# Patient Record
Sex: Male | Born: 1980 | Race: White | Hispanic: No | Marital: Married | Smoking: Never smoker
Health system: Southern US, Community
[De-identification: ages and names within clinical notes are randomized; demographics above are authoritative.]

## PROBLEM LIST (undated history)

## (undated) DIAGNOSIS — K8020 Calculus of gallbladder without cholecystitis without obstruction: Secondary | ICD-10-CM

## (undated) DIAGNOSIS — K219 Gastro-esophageal reflux disease without esophagitis: Secondary | ICD-10-CM

## (undated) DIAGNOSIS — K2270 Barrett's esophagus without dysplasia: Secondary | ICD-10-CM

## (undated) HISTORY — PX: WRIST SURGERY: SHX841

## (undated) SURGERY — EGD (ESOPHAGOGASTRODUODENOSCOPY)
Anesthesia: Moderate Sedation

---

## 2000-08-21 ENCOUNTER — Emergency Department (HOSPITAL_COMMUNITY): Admission: EM | Admit: 2000-08-21 | Discharge: 2000-08-21 | Payer: Self-pay | Admitting: Emergency Medicine

## 2010-09-15 ENCOUNTER — Ambulatory Visit (INDEPENDENT_AMBULATORY_CARE_PROVIDER_SITE_OTHER): Payer: Self-pay | Admitting: Surgery

## 2010-09-21 ENCOUNTER — Encounter (INDEPENDENT_AMBULATORY_CARE_PROVIDER_SITE_OTHER): Payer: Self-pay | Admitting: Surgery

## 2010-09-21 ENCOUNTER — Ambulatory Visit (INDEPENDENT_AMBULATORY_CARE_PROVIDER_SITE_OTHER): Payer: BC Managed Care – PPO | Admitting: Surgery

## 2010-09-21 VITALS — BP 126/80 | HR 65 | Temp 97.6°F | Ht 70.0 in | Wt 213.4 lb

## 2010-09-21 DIAGNOSIS — K219 Gastro-esophageal reflux disease without esophagitis: Secondary | ICD-10-CM

## 2010-09-21 DIAGNOSIS — K8010 Calculus of gallbladder with chronic cholecystitis without obstruction: Secondary | ICD-10-CM | POA: Insufficient documentation

## 2010-09-21 MED ORDER — OMEPRAZOLE-SODIUM BICARBONATE 40-1100 MG PO CAPS: 1.0000 | ORAL_CAPSULE | Freq: Every day | ORAL | Status: AC

## 2010-09-21 NOTE — Progress Notes (Signed)
Barry Robertson is a 30 y.o. male.    Chief Complaint  Patient presents with  . Abdominal Pain    HPI HPI This is a healthy 30 year old male referred by Dr. Lacie Scotts at [facility] for evaluation of gallstones. The patient has had a history of severe heartburn lasting about 8 years. He has been treating with only over-the-counter urgent generic omeprazole with minimal relief. He sleeps on several pillows. Frequently wakes up with some regurgitation and burning in his throat. He denies any abdominal pain or abdominal bloating. No change in his bowel habits. He eats fairly healthy and avoids any fried foods. He recently underwent a routine physical and was sent for an ultrasound. The ultrasound performed 08/31/10 showed cholelithiasis but no sign of cholecystitis or common bile duct obstruction. Apparently, he did have some blood work performed but we do not have that available today. He is referred for discussion of laparoscopic cholecystectomy.  Past Medical History  Diagnosis Date  . Gout     Past Surgical History  Procedure Date  . Wrist surgery     History reviewed. No pertinent family history.  Social History History  Substance Use Topics  . Smoking status: Never Smoker   . Smokeless tobacco: Never Used  . Alcohol Use: Yes    No Known Allergies  Current Outpatient Prescriptions  Medication Sig Dispense Refill  . omeprazole-sodium bicarbonate (ZEGERID) 40-1100 MG per capsule Take 1 capsule by mouth daily before breakfast.  30 capsule  3    Review of Systems ROS Review of systems reviewed with the patient and is otherwise negative except for the severe reflux Physical Exam Physical Exam   Blood pressure 126/80, pulse 65, temperature 97.6 F (36.4 C), temperature source Temporal, height 5\' 10"  (1.778 m), weight 213 lb 6.4 oz (96.798 kg). WDWN in NAD HEENT:  EOMI, sclera anicteric Neck:  No masses, no thyromegaly Lungs:  CTA bilaterally; normal  respiratory effort CV:  Regular rate and rhythm; no murmurs Abd:  +bowel sounds, soft, non-tender, no masses Ext:  Well-perfused; no edema Skin:  Warm, dry; no sign of jaundice  Assessment/Plan Cholelithiasis-this appears to be minimally symptomatic at this time and does not seem to be in urgent problem Severe GERD-this appears to be his main complaint and has been long-standing with minimal treatment. I would be concerned for possible damage to his esophagus with prolonged GERD. We will start him on prescription strength Zegerid today and we'll refer him to [facility] for evaluation and probable EGD to evaluate for esophagitis, Barrett's esophagus, etc. We will see him back after his GI evaluation is complete  Emmarie Sannes K. 09/21/2010, 9:49 AM

## 2010-10-05 ENCOUNTER — Encounter (INDEPENDENT_AMBULATORY_CARE_PROVIDER_SITE_OTHER): Payer: Self-pay | Admitting: Surgery

## 2010-10-09 ENCOUNTER — Encounter (INDEPENDENT_AMBULATORY_CARE_PROVIDER_SITE_OTHER): Payer: BC Managed Care – PPO | Admitting: Surgery

## 2010-10-23 ENCOUNTER — Encounter (INDEPENDENT_AMBULATORY_CARE_PROVIDER_SITE_OTHER): Payer: Self-pay | Admitting: Surgery

## 2016-09-09 ENCOUNTER — Encounter (INDEPENDENT_AMBULATORY_CARE_PROVIDER_SITE_OTHER): Payer: Self-pay | Admitting: Internal Medicine

## 2016-09-16 ENCOUNTER — Encounter (INDEPENDENT_AMBULATORY_CARE_PROVIDER_SITE_OTHER): Payer: Self-pay

## 2016-09-16 ENCOUNTER — Encounter (INDEPENDENT_AMBULATORY_CARE_PROVIDER_SITE_OTHER): Payer: Self-pay | Admitting: *Deleted

## 2016-09-16 ENCOUNTER — Ambulatory Visit (INDEPENDENT_AMBULATORY_CARE_PROVIDER_SITE_OTHER): Payer: Managed Care, Other (non HMO) | Admitting: Internal Medicine

## 2016-09-16 ENCOUNTER — Other Ambulatory Visit (INDEPENDENT_AMBULATORY_CARE_PROVIDER_SITE_OTHER): Payer: Self-pay | Admitting: Internal Medicine

## 2016-09-16 ENCOUNTER — Encounter (INDEPENDENT_AMBULATORY_CARE_PROVIDER_SITE_OTHER): Payer: Self-pay | Admitting: Internal Medicine

## 2016-09-16 VITALS — BP 110/80 | HR 72 | Temp 97.5°F | Ht 70.0 in | Wt 245.1 lb

## 2016-09-16 DIAGNOSIS — K219 Gastro-esophageal reflux disease without esophagitis: Secondary | ICD-10-CM

## 2016-09-16 MED ORDER — PANTOPRAZOLE SODIUM 40 MG PO TBEC: 40.0000 mg | DELAYED_RELEASE_TABLET | Freq: Two times a day (BID) | ORAL | 3 refills | Status: DC

## 2016-09-16 NOTE — Progress Notes (Signed)
   Subjective:    Patient ID: Barry Robertson, male    DOB: [birth year], 36 y.o.   MRN: [id number]  HPI Referred by Dr. Karilyn CotaGosrani for GERD. Has had GERD for 16-17 yrs. Has been taking Prilosec for at least 16 yrs. He has to take multiple Prilosec's a day. On a normal day he takes 4-8 Prilosec, Tums daily.  He says at night he might vomit, and chokes at night. He has burning in his esophagus.  [facility] and had an endo 4 yrs and was told he had a hiatal hernia. He says he has gallstones.       Review of Systems Past Medical History:  Diagnosis Date  . Gout     Past Surgical History:  Procedure Laterality Date  . WRIST SURGERY      No Known Allergies  No current outpatient prescriptions on file prior to visit.   No current facility-administered medications on file prior to visit.    Current Outpatient Prescriptions  Medication Sig Dispense Refill  . calcium carbonate (TUMS - DOSED IN MG ELEMENTAL CALCIUM) 500 MG chewable tablet Chew 1 tablet by mouth as needed for indigestion or heartburn.    Marland Kitchen. omeprazole (PRILOSEC) 20 MG capsule Take 20 mg by mouth as needed.     No current facility-administered medications for this visit.         Objective:   Physical Exam  Blood pressure 110/80, pulse 72, temperature (!) 97.5 F (36.4 C), height 5\' 10"  (1.778 m), weight 245 lb 1.6 oz (111.2 kg).  Alert and oriented. Skin warm and dry. Oral mucosa is moist.   . Sclera anicteric, conjunctivae is pink. Thyroid not enlarged. No cervical lymphadenopathy. Lungs clear. Heart regular rate and rhythm.  Abdomen is soft. Bowel sounds are positive. No hepatomegaly. No abdominal masses felt. No tenderness.  No edema to lower extremities.          Assessment & Plan:  Uncontrolled GERD. EGD. GERD Diet given to patient. Rx for Protonix sent to her pharmacy.  The risks of bleeding, perforation and infection were reviewed with patient.

## 2016-09-16 NOTE — Patient Instructions (Addendum)
EGD. The risks of bleeding, perforation and infection were reviewed with patient. GERD diet given to patient.

## 2016-11-05 ENCOUNTER — Encounter (HOSPITAL_COMMUNITY)
Admission: RE | Disposition: A | Discharge status: Home / Self Care | Payer: Self-pay | Source: Ambulatory Visit | Attending: Internal Medicine

## 2016-11-05 ENCOUNTER — Ambulatory Visit (HOSPITAL_COMMUNITY)
Admission: RE | Admit: 2016-11-05 | Discharge: 2016-11-05 | Disposition: A | Discharge status: Home / Self Care | Payer: Managed Care, Other (non HMO) | Source: Ambulatory Visit | Attending: Internal Medicine | Admitting: Internal Medicine

## 2016-11-05 ENCOUNTER — Encounter (HOSPITAL_COMMUNITY): Payer: Self-pay | Admitting: *Deleted

## 2016-11-05 DIAGNOSIS — K317 Polyp of stomach and duodenum: Secondary | ICD-10-CM | POA: Insufficient documentation

## 2016-11-05 DIAGNOSIS — K3189 Other diseases of stomach and duodenum: Secondary | ICD-10-CM

## 2016-11-05 DIAGNOSIS — Z79899 Other long term (current) drug therapy: Secondary | ICD-10-CM | POA: Diagnosis not present

## 2016-11-05 DIAGNOSIS — K449 Diaphragmatic hernia without obstruction or gangrene: Secondary | ICD-10-CM | POA: Diagnosis not present

## 2016-11-05 DIAGNOSIS — K219 Gastro-esophageal reflux disease without esophagitis: Secondary | ICD-10-CM | POA: Diagnosis not present

## 2016-11-05 DIAGNOSIS — K210 Gastro-esophageal reflux disease with esophagitis: Secondary | ICD-10-CM | POA: Diagnosis not present

## 2016-11-05 DIAGNOSIS — K228 Other specified diseases of esophagus: Secondary | ICD-10-CM | POA: Diagnosis not present

## 2016-11-05 HISTORY — PX: ESOPHAGOGASTRODUODENOSCOPY: SHX5428

## 2016-11-05 HISTORY — PX: BIOPSY: SHX5522

## 2016-11-05 SURGERY — EGD (ESOPHAGOGASTRODUODENOSCOPY)
Anesthesia: Moderate Sedation

## 2016-11-05 MED ORDER — FAMOTIDINE 20 MG PO TABS: 20.0000 mg | ORAL_TABLET | Freq: Every day | ORAL | Status: DC

## 2016-11-05 MED ORDER — MEPERIDINE HCL 50 MG/ML IJ SOLN
INTRAMUSCULAR | Status: DC | PRN
  Administered 2016-11-05 (×4): 25 mg via INTRAVENOUS

## 2016-11-05 MED ORDER — LIDOCAINE VISCOUS 2 % MT SOLN
OROMUCOSAL | Status: DC | PRN
  Administered 2016-11-05: 4 mL via OROMUCOSAL

## 2016-11-05 MED ORDER — LIDOCAINE VISCOUS 2 % MT SOLN
OROMUCOSAL | Status: AC
  Filled 2016-11-05: qty 15

## 2016-11-05 MED ORDER — MEPERIDINE HCL 50 MG/ML IJ SOLN
INTRAMUSCULAR | Status: AC
  Filled 2016-11-05: qty 1

## 2016-11-05 MED ORDER — STERILE WATER FOR IRRIGATION IR SOLN
Status: DC | PRN
  Administered 2016-11-05: 11:00:00

## 2016-11-05 MED ORDER — SODIUM CHLORIDE 0.9 % IV SOLN
INTRAVENOUS | Status: DC
  Administered 2016-11-05: 1000 mL via INTRAVENOUS

## 2016-11-05 MED ORDER — MIDAZOLAM HCL 5 MG/5ML IJ SOLN
INTRAMUSCULAR | Status: AC
  Filled 2016-11-05: qty 10

## 2016-11-05 MED ORDER — MIDAZOLAM HCL 5 MG/5ML IJ SOLN
INTRAMUSCULAR | Status: DC | PRN
  Administered 2016-11-05: 3 mg via INTRAVENOUS
  Administered 2016-11-05: 2 mg via INTRAVENOUS
  Administered 2016-11-05: 3 mg via INTRAVENOUS
  Administered 2016-11-05: 2 mg via INTRAVENOUS

## 2016-11-05 NOTE — Discharge Instructions (Signed)
Discontinue omeprazole.  ********  See note below ********** Pantoprazole 40 mg by mouth 30 minutes before breakfast and evening meal daily. Famotidine OTC 20 mg by mouth daily at bedtime. Resume other medications and diet as before. No driving for 24 hours. Physician will call with biopsy results.  *****Per Dr. Karilyn Cota, patient to continue with omeprazole  twice daily and Famotidine at bedtime until Prior Authorization for Pantoprazole can be done by Dr. Patty Sermons office on Monday November 08, 2016.  ***********    Esophagogastroduodenoscopy, Care After Refer to this sheet in the next few weeks. These instructions provide you with information about caring for yourself after your procedure. Your health care provider may also give you more specific instructions. Your treatment has been planned according to current medical practices, but problems sometimes occur. Call your health care provider if you have any problems or questions after your procedure. What can I expect after the procedure? After the procedure, it is common to have:  A sore throat.  Nausea.  Bloating.  Dizziness.  Fatigue.  Follow these instructions at home:  Do not eat or drink anything until the numbing medicine (local anesthetic) has worn off and your gag reflex has returned. You will know that the local anesthetic has worn off when you can swallow comfortably.  Do not drive for 24 hours if you received a medicine to help you relax (sedative).  If your health care provider took a tissue sample for testing during the procedure, make sure to get your test results. This is your responsibility. Ask your health care provider or the department performing the test when your results will be ready.  Keep all follow-up visits as told by your health care provider. This is important. Contact a health care provider if:  You cannot stop coughing.  You are not urinating.  You are urinating less than usual. Get help right  away if:  You have trouble swallowing.  You cannot eat or drink.  You have throat or chest pain that gets worse.  You are dizzy or light-headed.  You faint.  You have nausea or vomiting.  You have chills.  You have a fever.  You have severe abdominal pain.  You have black, tarry, or bloody stools. This information is not intended to replace advice given to you by your health care provider. Make sure you discuss any questions you have with your health care provider. Document Released: 01/12/2012 Document Revised: 07/03/2015 Document Reviewed: 12/19/2014 Elsevier Interactive Patient Education  2018 [employer]    Hiatal Hernia A hiatal hernia occurs when part of the stomach slides above the muscle that separates the abdomen from the chest (diaphragm). A person can be born with a hiatal hernia (congenital), or it may develop over time. In almost all cases of hiatal hernia, only the top part of the stomach pushes through the diaphragm. Many people have a hiatal hernia with no symptoms. The larger the hernia, the more likely it is that you will have symptoms. In some cases, a hiatal hernia allows stomach acid to flow back into the tube that carries food from your mouth to your stomach (esophagus). This may cause heartburn symptoms. Severe heartburn symptoms may mean that you have developed a condition called gastroesophageal reflux disease (GERD). What are the causes? This condition is caused by a weakness in the opening (hiatus) where the esophagus passes through the diaphragm to attach to the upper part of the stomach. A person may be born with a weakness in  the hiatus, or a weakness can develop over time. What increases the risk? This condition is more likely to develop in:  Older people. Age is a major risk factor for a hiatal hernia, especially if you are over the age of 31.  Pregnant women.  People who are overweight.  People who have frequent constipation.  What are  the signs or symptoms? Symptoms of this condition usually develop in the form of GERD symptoms. Symptoms include:  Heartburn.  Belching.  Indigestion.  Trouble swallowing.  Coughing or wheezing.  Sore throat.  Hoarseness.  Chest pain.  Nausea and vomiting.  How is this diagnosed? This condition may be diagnosed during testing for GERD. Tests that may be done include:  X-rays of your stomach or chest.  An upper gastrointestinal (GI) series. This is an X-ray exam of your GI tract that is taken after you swallow a chalky liquid that shows up clearly on the X-ray.  Endoscopy. This is a procedure to look into your stomach using a thin, flexible tube that has a tiny camera and light on the end of it.  How is this treated? This condition may be treated by:  Dietary and lifestyle changes to help reduce GERD symptoms.  Medicines. These may include: ? Over-the-counter antacids. ? Medicines that make your stomach empty more quickly. ? Medicines that block the production of stomach acid (H2 blockers). ? Stronger medicines to reduce stomach acid (proton pump inhibitors).  Surgery to repair the hernia, if other treatments are not helping.  If you have no symptoms, you may not need treatment. Follow these instructions at home: Lifestyle and activity  Do not use any products that contain nicotine or tobacco, such as cigarettes and e-cigarettes. If you need help quitting, ask your health care provider.  Try to achieve and maintain a healthy body weight.  Avoid putting pressure on your abdomen. Anything that puts pressure on your abdomen increases the amount of acid that may be pushed up into your esophagus. ? Avoid bending over, especially after eating. ? Raise the head of your bed by putting blocks under the legs. This keeps your head and esophagus higher than your stomach. ? Do not wear tight clothing around your chest or stomach. ? Try not to strain when having a bowel  movement, when urinating, or when lifting heavy objects. Eating and drinking  Avoid foods that can worsen GERD symptoms. These may include: ? Fatty foods, like fried foods. ? Citrus fruits, like oranges or lemon. ? Other foods and drinks that contain acid, like orange juice or tomatoes. ? Spicy food. ? Chocolate.  Eat frequent small meals instead of three large meals a day. This helps prevent your stomach from getting too full. ? Eat slowly. ? Do not lie down right after eating. ? Do not eat 1-2 hours before bed.  Do not drink beverages with caffeine. These include cola, coffee, cocoa, and tea.  Do not drink alcohol. General instructions  Take over-the-counter and prescription medicines only as told by your health care provider.  Keep all follow-up visits as told by your health care provider. This is important. Contact a health care provider if:  Your symptoms are not controlled with medicines or lifestyle changes.  You are having trouble swallowing.  You have coughing or wheezing that will not go away. Get help right away if:  Your pain is getting worse.  Your pain spreads to your arms, neck, jaw, teeth, or back.  You have shortness of breath.  You sweat for no reason.  You feel sick to your stomach (nauseous) or you vomit.  You vomit blood.  You have bright red blood in your stools.  You have black, tarry stools. This information is not intended to replace advice given to you by your health care provider. Make sure you discuss any questions you have with your health care provider. Document Released: 04/17/2003 Document Revised: 01/19/2016 Document Reviewed: 01/19/2016 Elsevier Interactive Patient Education  [employer].

## 2016-11-05 NOTE — Op Note (Signed)
[facility] Patient Name: Barry Robertson Procedure Date: 11/05/2016 10:37 AM MRN: [id number] Date of Birth: 07/31/1980 Attending MD: Lionel December , MD CSN: [id number] Age: 36 Admit Type: Outpatient Procedure:                Upper GI endoscopy Indications:              Follow-up of gastro-esophageal reflux disease,                            Failure to respond to medical treatment Providers:                Lionel December, MD, Loma Messing B. Patsy Lager, RN, Dyann Ruddle Referring MD:             Wilson Singer, MD Medicines:                Lidocaine spray, Meperidine 100 mg IV, Midazolam 10                            mg IV Complications:            No immediate complications. Estimated Blood Loss:     Estimated blood loss was minimal. Procedure:                Pre-Anesthesia Assessment:                           - Prior to the procedure, a History and Physical                            was performed, and patient medications and                            allergies were reviewed. The patient's tolerance of                            previous anesthesia was also reviewed. The risks                            and benefits of the procedure and the sedation                            options and risks were discussed with the patient.                            All questions were answered, and informed consent                            was obtained. Prior Anticoagulants: The patient has                            taken no previous anticoagulant or antiplatelet  agents. ASA Grade Assessment: I - A normal, healthy                            patient. After reviewing the risks and benefits,                            the patient was deemed in satisfactory condition to                            undergo the procedure.                           After obtaining informed consent, the endoscope was                            passed under direct  vision. Throughout the                            procedure, the patient's blood pressure, pulse, and                            oxygen saturations were monitored continuously. The                            [id number]) scope was introduced through the                            mouth, and advanced to the second part of duodenum.                            The upper GI endoscopy was accomplished without                            difficulty. The patient tolerated the procedure                            well. The upper GI endoscopy was accomplished                            without difficulty. The patient tolerated the                            procedure well. Scope In: 11:17:33 AM Scope Out: 11:27:59 AM Total Procedure Duration: 0 hours 10 minutes 26 seconds  Findings:      LA Grade A (one or more mucosal breaks less than 5 mm, not extending       between tops of 2 mucosal folds) esophagitis was found 33 to 34 cm from       the incisors.      The Z-line was irregular and was found 34 cm from the incisors. Biopsies       were taken with a cold forceps for histology. The pathology specimen was       placed into Bottle Number 1.      A 4 cm hiatal hernia was present.  A single 4 mm sessile polyp was found in the gastric antrum. The       pathology specimen was placed into Bottle Number 2.      Patchy mildly erythematous mucosa without bleeding was found in the       gastric antrum.      The exam of the stomach was otherwise normal.      The duodenal bulb and second portion of the duodenum were normal. Impression:               - LA Grade A reflux esophagitis.                           - Z-line irregular, 34 cm from the incisors.                            Biopsied.                           - 4 cm hiatal hernia.                           - A single gastric polyp.                           - Erythematous mucosa in the antrum.                           - Normal duodenal bulb  and second portion of the                            duodenum. Moderate Sedation:      Moderate (conscious) sedation was administered by the endoscopy nurse       and supervised by the endoscopist. The following parameters were       monitored: oxygen saturation, heart rate, blood pressure, CO2       capnography and response to care. Total physician intraservice time was       14 minutes. Recommendation:           - Patient has a contact number available for                            emergencies. The signs and symptoms of potential                            delayed complications were discussed with the                            patient. Return to normal activities tomorrow.                            Written discharge instructions were provided to the                            patient.                           - Resume previous diet  today.                           - discontinue omeprazole.                           - Begin pantoprazole 40 mg by mouth twice a day.                           - Famotidine 20 mg by mouth daily at bedtime.                           - Continue other medications.                           - Await pathology results. Procedure Code(s):        --- Professional ---                           [phone], Esophagogastroduodenoscopy, flexible,                            transoral; with biopsy, single or multiple                           99152, Moderate sedation services provided by the                            same physician or other qualified health care                            professional performing the diagnostic or                            therapeutic service that the sedation supports,                            requiring the presence of an independent trained                            observer to assist in the monitoring of the                            patient's level of consciousness and physiological                            status; initial 15  minutes of intraservice time,                            patient age 17 years or older Diagnosis Code(s):        --- Professional ---                           K21.0, Gastro-esophageal reflux disease with  esophagitis                           K22.8, Other specified diseases of esophagus                           K44.9, Diaphragmatic hernia without obstruction or                            gangrene                           K31.7, Polyp of stomach and duodenum                           K31.89, Other diseases of stomach and duodenum CPT copyright 2016 American Medical Association. All rights reserved. The codes documented in this report are preliminary and upon coder review may  be revised to meet current compliance requirements. Lionel December, MD Lionel December, MD 11/05/2016 11:44:03 AM This report has been signed electronically. Number of Addenda: 0

## 2016-11-05 NOTE — H&P (Signed)
Barry Robertson is an 35 y.o. male.   Chief Complaint: patient is here for EGD. HPI: Patient is 45 old Caucasian male presented symptoms of GERD for about over 15 years. He is taking as much as 80 mg of Prilosec daily. His symptoms not well controlled. He has frequent heartburn at nightwith regurgitation. He denies nausea vomiting or dysphagia. He also denies melena or rectal bleeding. He has cholelithiasis. He's had3 disks distinct episodes of abdominal painrelieved spontaneously. He does not smoke cigarettes. He drinks alcohol sporadically. Last EGD was in August 2012 by [facility] physician; it revealed small sliding hiatal hernia.  Past Medical History:  Diagnosis Date  . Gout     Past Surgical History:  Procedure Laterality Date  . WRIST SURGERY Left     History reviewed. No pertinent family history. Social History:  reports that he has never smoked. He has never used smokeless tobacco. He reports that he drinks alcohol. He reports that he does not use drugs.  Allergies: No Known Allergies  Medications Prior to Admission  Medication Sig Dispense Refill  . omeprazole (PRILOSEC) 20 MG capsule Take 20 mg by mouth as needed.    . calcium carbonate (TUMS - DOSED IN MG ELEMENTAL CALCIUM) 500 MG chewable tablet Chew 1 tablet by mouth as needed for indigestion or heartburn.    . pantoprazole (PROTONIX) 40 MG tablet Take 1 tablet (40 mg total) by mouth 2 (two) times daily before a meal. 60 tablet 3    No results found for this or any previous visit (from the past 48 hour(s)). No results found.  ROS  Blood pressure (!) 153/100, pulse 63, temperature 97.8 F (36.6 C), temperature source Oral, resp. rate 14, height  (1.778 m), weight 238 lb (108 kg), SpO2 97 %. Physical Exam  Constitutional: He appears well-developed and well-nourished.  HENT:  Mouth/Throat: Oropharynx is clear and moist.  Eyes: Conjunctivae are normal. No scleral icterus.  Neck: No thyromegaly present.   Cardiovascular: Normal rate, regular rhythm and normal heart sounds.   No murmur heard. Respiratory: Effort normal and breath sounds normal.  GI: Soft. He exhibits no distension and no mass. There is no tenderness.  Musculoskeletal: He exhibits no edema.  Neurological: He is alert.  Skin: Skin is warm and dry.     Assessment/Plan GERD refractory to medical therapy. Diagnostic EGD.   Lionel December, MD 11/05/2016, 11:07 AM

## 2016-11-11 ENCOUNTER — Encounter (HOSPITAL_COMMUNITY): Payer: Self-pay | Admitting: Internal Medicine

## 2016-11-17 ENCOUNTER — Telehealth (INDEPENDENT_AMBULATORY_CARE_PROVIDER_SITE_OTHER): Payer: Self-pay | Admitting: *Deleted

## 2016-11-17 NOTE — Telephone Encounter (Signed)
A PA was completed for the patient for the Pantoprazole 40 mg bid.

## 2016-11-22 NOTE — Progress Notes (Signed)
Sent a staff message to Reba for an appointment.

## 2016-11-25 ENCOUNTER — Encounter (INDEPENDENT_AMBULATORY_CARE_PROVIDER_SITE_OTHER): Payer: Self-pay | Admitting: Internal Medicine

## 2017-01-25 ENCOUNTER — Ambulatory Visit (INDEPENDENT_AMBULATORY_CARE_PROVIDER_SITE_OTHER): Payer: Managed Care, Other (non HMO) | Admitting: Internal Medicine

## 2017-06-27 ENCOUNTER — Other Ambulatory Visit (INDEPENDENT_AMBULATORY_CARE_PROVIDER_SITE_OTHER): Payer: Self-pay | Admitting: Internal Medicine

## 2017-06-27 DIAGNOSIS — K219 Gastro-esophageal reflux disease without esophagitis: Secondary | ICD-10-CM

## 2017-09-27 ENCOUNTER — Encounter (INDEPENDENT_AMBULATORY_CARE_PROVIDER_SITE_OTHER): Payer: Self-pay | Admitting: Internal Medicine

## 2017-09-27 ENCOUNTER — Ambulatory Visit (INDEPENDENT_AMBULATORY_CARE_PROVIDER_SITE_OTHER): Payer: BLUE CROSS/BLUE SHIELD | Admitting: Internal Medicine

## 2017-09-27 VITALS — BP 130/88 | HR 76 | Temp 98.5°F | Resp 18 | Ht 70.0 in | Wt 235.5 lb

## 2017-09-27 DIAGNOSIS — K2270 Barrett's esophagus without dysplasia: Secondary | ICD-10-CM

## 2017-09-27 DIAGNOSIS — K219 Gastro-esophageal reflux disease without esophagitis: Secondary | ICD-10-CM

## 2017-09-27 NOTE — Patient Instructions (Signed)
Take pantoprazole 30 minutes before supper and famotidine or Pepcid at bedtime. Call office with progress report in 2 to 3 months.

## 2017-09-27 NOTE — Progress Notes (Signed)
Presenting complaint;  Follow-up for chronic GERD complicated by short segment Barrett's esophagus.  Database and subjective:  Patient is 37 year old Caucasian male who is here for scheduled visit.  He was seen 1 year ago for symptoms of GERD not controlled with 80 mg of omeprazole daily.  He has had GERD symptoms more than 15 years. He underwent esophagogastroduodenoscopy on 11/05/2017 which revealed to tongues of salmon-colored mucosa consistent with Barrett's esophagus, small sliding hiatal hernia.  Esophageal biopsy confirmed Barrett's without dysplasia. Patient was switched to pantoprazole and antireflux measures were reinforced.  Patient states that he is doing well.  Presently he is only taking single dose of pantoprazole and famotidine or Pepcid in the evening.  He takes his medications after evening meal.  He has changed his eating habits and stays away from foods which are likely to cause heartburn.  He went on a keto diet after consultation with Dr. Karilyn CotaGosrani who is his primary care physician and lost 30 pounds.  He has wife went on vacation to [place] and has gained 20 pounds and now he is trying to lose weight again.  He rarely has heartburn or regurgitation.  He denies nausea vomiting dysphagia abdominal pain melena or throat symptoms. He is not having any side effects with pantoprazole.  Current Medications: Outpatient Encounter Medications as of 09/27/2017  Medication Sig  . calcium carbonate (TUMS - DOSED IN MG ELEMENTAL CALCIUM) 500 MG chewable tablet Chew 1 tablet by mouth as needed for indigestion or heartburn.  . famotidine (PEPCID) 20 MG tablet Take 1 tablet (20 mg total) by mouth at bedtime.  . pantoprazole (PROTONIX) 40 MG tablet TAKE 1 TABLET BY MOUTH TWICE DAILY BEFOREA MEAL   No facility-administered encounter medications on file as of 09/27/2017.      Objective: Blood pressure 130/88, pulse 76, temperature 98.5 F (36.9 C), temperature source Oral, resp. rate 18,  height 5\' 10"  (1.778 m), weight 235 lb 8 oz (106.8 kg). Patient is alert and in no acute distress. Conjunctiva is pink. Sclera is nonicteric Oropharyngeal mucosa is normal. No neck masses or thyromegaly noted. Cardiac exam with regular rhythm normal S1 and S2. No murmur or gallop noted. Lungs are clear to auscultation. Abdomen is full.  Is symmetrical with hypopigmented areas across lower abdomen/vitiligo.  On palpation is soft and nontender with organomegaly or masses. No LE edema or clubbing noted.   Assessment:  #1.  Chronic GERD complicated by short segment Barrett's esophagus.  Last EGD was in September 2018.  His symptom control is satisfactory with lifestyle modifications and PPI needs to be at bedtime.  However he needs to take pantoprazole before and not after meal.  Plan:  Patient advised to take pantoprazole 40 mg by mouth 30 minutes before breakfast or evening meal daily and he should take famotidine OTC 20 mg at bedtime. She is advised to call with progress report in 2 months.  If he continues to do well will change his PPI prescription to once a day. Office visit in 1 year.

## 2017-09-28 DIAGNOSIS — K2270 Barrett's esophagus without dysplasia: Secondary | ICD-10-CM | POA: Insufficient documentation

## 2018-01-27 ENCOUNTER — Emergency Department (HOSPITAL_COMMUNITY): Payer: BLUE CROSS/BLUE SHIELD

## 2018-01-27 ENCOUNTER — Encounter (HOSPITAL_COMMUNITY): Payer: Self-pay

## 2018-01-27 ENCOUNTER — Emergency Department (HOSPITAL_COMMUNITY)
Admission: EM | Admit: 2018-01-27 | Discharge: 2018-01-27 | Disposition: A | Discharge status: Home / Self Care | Payer: BLUE CROSS/BLUE SHIELD | Attending: Emergency Medicine | Admitting: Emergency Medicine

## 2018-01-27 DIAGNOSIS — K8020 Calculus of gallbladder without cholecystitis without obstruction: Secondary | ICD-10-CM | POA: Diagnosis not present

## 2018-01-27 DIAGNOSIS — R1013 Epigastric pain: Secondary | ICD-10-CM | POA: Diagnosis present

## 2018-01-27 DIAGNOSIS — K8080 Other cholelithiasis without obstruction: Secondary | ICD-10-CM

## 2018-01-27 HISTORY — DX: Calculus of gallbladder without cholecystitis without obstruction: K80.20

## 2018-01-27 LAB — URINALYSIS, ROUTINE W REFLEX MICROSCOPIC
BILIRUBIN URINE: NEGATIVE
Glucose, UA: NEGATIVE mg/dL
Hgb urine dipstick: NEGATIVE
Ketones, ur: NEGATIVE mg/dL
Leukocytes, UA: NEGATIVE
NITRITE: NEGATIVE
PH: 7 (ref 5.0–8.0)
Protein, ur: NEGATIVE mg/dL
SPECIFIC GRAVITY, URINE: 1.027 (ref 1.005–1.030)

## 2018-01-27 LAB — CBC WITH DIFFERENTIAL/PLATELET
Abs Immature Granulocytes: 0.1 10*3/uL — ABNORMAL HIGH (ref 0.00–0.07)
BASOS ABS: 0.1 10*3/uL (ref 0.0–0.1)
Basophils Relative: 1 %
EOS PCT: 2 %
Eosinophils Absolute: 0.1 10*3/uL (ref 0.0–0.5)
HEMATOCRIT: 48.8 % (ref 39.0–52.0)
HEMOGLOBIN: 16.4 g/dL (ref 13.0–17.0)
Immature Granulocytes: 1 %
LYMPHS ABS: 2.5 10*3/uL (ref 0.7–4.0)
LYMPHS PCT: 30 %
MCH: 29.5 pg (ref 26.0–34.0)
MCHC: 33.6 g/dL (ref 30.0–36.0)
MCV: 87.9 fL (ref 80.0–100.0)
MONO ABS: 0.6 10*3/uL (ref 0.1–1.0)
Monocytes Relative: 8 %
Neutro Abs: 4.8 10*3/uL (ref 1.7–7.7)
Neutrophils Relative %: 58 %
Platelets: 256 10*3/uL (ref 150–400)
RBC: 5.55 MIL/uL (ref 4.22–5.81)
RDW: 12.8 % (ref 11.5–15.5)
WBC: 8.1 10*3/uL (ref 4.0–10.5)
nRBC: 0 % (ref 0.0–0.2)

## 2018-01-27 LAB — COMPREHENSIVE METABOLIC PANEL
ALBUMIN: 4.4 g/dL (ref 3.5–5.0)
ALK PHOS: 49 U/L (ref 38–126)
ALT: 111 U/L — ABNORMAL HIGH (ref 0–44)
AST: 45 U/L — ABNORMAL HIGH (ref 15–41)
Anion gap: 9 (ref 5–15)
BUN: 22 mg/dL — ABNORMAL HIGH (ref 6–20)
CALCIUM: 9 mg/dL (ref 8.9–10.3)
CO2: 27 mmol/L (ref 22–32)
CREATININE: 1.38 mg/dL — AB (ref 0.61–1.24)
Chloride: 102 mmol/L (ref 98–111)
GFR calc Af Amer: >60 mL/min (ref >60)
GFR calc non Af Amer: >60 mL/min (ref >60)
GLUCOSE: 130 mg/dL — AB (ref 70–99)
Potassium: 3.6 mmol/L (ref 3.5–5.1)
SODIUM: 138 mmol/L (ref 135–145)
Total Bilirubin: 1.1 mg/dL (ref 0.3–1.2)
Total Protein: 7.6 g/dL (ref 6.5–8.1)

## 2018-01-27 LAB — LIPASE, BLOOD: Lipase: 49 U/L (ref 11–51)

## 2018-01-27 MED ORDER — SODIUM CHLORIDE 0.9 % IV BOLUS
1000.0000 mL | Freq: Once | INTRAVENOUS | Status: AC
  Administered 2018-01-27: 1000 mL via INTRAVENOUS

## 2018-01-27 MED ORDER — MORPHINE SULFATE (PF) 4 MG/ML IV SOLN
4.0000 mg | Freq: Once | INTRAVENOUS | Status: AC
  Administered 2018-01-27: 4 mg via INTRAVENOUS
  Filled 2018-01-27: qty 1

## 2018-01-27 MED ORDER — IOPAMIDOL (ISOVUE-300) INJECTION 61%
100.0000 mL | Freq: Once | INTRAVENOUS | Status: AC | PRN
  Administered 2018-01-27: 100 mL via INTRAVENOUS

## 2018-01-27 MED ORDER — ONDANSETRON HCL 8 MG PO TABS: 8.0000 mg | ORAL_TABLET | ORAL | 0 refills | Status: DC | PRN

## 2018-01-27 MED ORDER — FENTANYL CITRATE (PF) 100 MCG/2ML IJ SOLN
50.0000 ug | Freq: Once | INTRAMUSCULAR | Status: AC
  Administered 2018-01-27: 50 ug via INTRAVENOUS
  Filled 2018-01-27: qty 2

## 2018-01-27 MED ORDER — CEFTRIAXONE SODIUM 2 G IJ SOLR
2.0000 g | Freq: Once | INTRAMUSCULAR | Status: AC
  Administered 2018-01-27: 2 g via INTRAVENOUS
  Filled 2018-01-27: qty 20

## 2018-01-27 MED ORDER — ONDANSETRON HCL 4 MG/2ML IJ SOLN
4.0000 mg | Freq: Once | INTRAMUSCULAR | Status: AC
  Administered 2018-01-27: 4 mg via INTRAVENOUS
  Filled 2018-01-27: qty 2

## 2018-01-27 MED ORDER — OXYCODONE-ACETAMINOPHEN 5-325 MG PO TABS: 1.0000 | ORAL_TABLET | ORAL | 0 refills | Status: DC | PRN

## 2018-01-27 MED ORDER — MORPHINE SULFATE (PF) 4 MG/ML IV SOLN
4.0000 mg | Freq: Once | INTRAVENOUS | Status: AC
Start: 2018-01-27 — End: 2018-01-27
  Administered 2018-01-27: 4 mg via INTRAVENOUS
  Filled 2018-01-27: qty 1

## 2018-01-27 NOTE — Discharge Instructions (Addendum)
Medication for pain and nausea.  Call Dr. Lovell SheehanJenkins office this afternoon for appointment Tuesday.  Return to the emergency department this weekend if getting worse.

## 2018-01-27 NOTE — ED Provider Notes (Signed)
[facility] EMERGENCY DEPARTMENT Provider Note   CSN: [id number] Arrival date & time: 01/27/18  0315  Time seen 3:30 AM   History   Chief Complaint Chief Complaint  Patient presents with  . Abdominal Pain    HPI Barry Robertson is a 37 y.o. male.  HPI patient states he had an ultrasound done 16 years ago that showed he had multiple gallstones.  At that time he had a lot of GERD and he was taking 7 Prilosec a day.  He states about a year ago he was changed to Protonix and famotidine.  He had a repeat ultrasound about 7 years ago which also showed multiple gallstones.  He states his doctor at that time was told him not to have it removed unless it was causing him issues.  He states for the past year he gets abdominal discomfort at least every week however he has had 2 major episodes of pain, tonight after eating fried chicken and asparagus at 7 PM he started getting epigastric pain at 9 PM that radiates into his back.  He states it goes also down to his periumbilical area.  He describes it as very sharp.  He has had nausea with vomiting but denies fever.  He states he now sees Dr. Karilyn Cota gastroenterologist, and had a recent endoscopy which showed Barrett  Esophagus.  PCP Benita Stabile, MD   Past Medical History:  Diagnosis Date  . Gallstones   . Gout     Patient Active Problem List   Diagnosis Date Noted  . Short-segment Barrett's esophagus 09/28/2017  . Gastroesophageal reflux disease without esophagitis 09/16/2016  . GERD (gastroesophageal reflux disease) 09/21/2010  . Cholecystitis with cholelithiasis 09/21/2010    Past Surgical History:  Procedure Laterality Date  . BIOPSY  11/05/2016   Procedure: BIOPSY;  Surgeon: Malissa Hippo, MD;  Location: [facility];  Service: Endoscopy;;  esophagus antrum  . ESOPHAGOGASTRODUODENOSCOPY N/A 11/05/2016   Procedure: ESOPHAGOGASTRODUODENOSCOPY (EGD);  Surgeon: Malissa Hippo, MD;  Location: [facility];  Service: Endoscopy;   Laterality: N/A;  10:50  . WRIST SURGERY Left         Home Medications    Prior to Admission medications   Medication Sig Start Date End Date Taking? Authorizing Provider  famotidine (PEPCID) 20 MG tablet Take 1 tablet (20 mg total) by mouth at bedtime. 11/05/16  Yes Rehman, Joline Maxcy, MD  pantoprazole (PROTONIX) 40 MG tablet TAKE 1 TABLET BY MOUTH TWICE DAILY BEFOREA MEAL 06/27/17  Yes Setzer, Terri L, NP  calcium carbonate (TUMS - DOSED IN MG ELEMENTAL CALCIUM) 500 MG chewable tablet Chew 1 tablet by mouth as needed for indigestion or heartburn.    [provider]    Family History No family history on file.  Social History Social History   Tobacco Use  . Smoking status: Never Smoker  . Smokeless tobacco: Never Used  Substance Use Topics  . Alcohol use: Yes    Comment: rarely  . Drug use: No  employed with non-profit   Allergies   Patient has no known allergies.   Review of Systems Review of Systems  All other systems reviewed and are negative.    Physical Exam Updated Vital Signs BP 137/82   Pulse (!) 51   Temp (!) 97.5 F (36.4 C) (Oral)   Resp 20   Ht 5\' 10"  (1.778 m)   Wt 106.6 kg   SpO2 98%   BMI 33.72 kg/m   Vital  signs normal except bradycardia   Physical Exam Vitals signs and nursing note reviewed.  Constitutional:      General: He is in acute distress.     Appearance: Normal appearance. He is well-developed. He is not ill-appearing or toxic-appearing.     Comments: Patient pacing around the room holding his abdomen  HENT:     Head: Normocephalic and atraumatic.     Right Ear: External ear normal.     Left Ear: External ear normal.     Nose: Nose normal. No mucosal edema or rhinorrhea.     Mouth/Throat:     Dentition: No dental abscesses.     Pharynx: No uvula swelling.  Eyes:     Conjunctiva/sclera: Conjunctivae normal.     Pupils: Pupils are equal, round, and reactive to light.  Neck:     Musculoskeletal: Full passive range  of motion without pain, normal range of motion and neck supple.  Cardiovascular:     Rate and Rhythm: Normal rate and regular rhythm.     Heart sounds: Normal heart sounds. No murmur. No friction rub. No gallop.   Pulmonary:     Effort: Pulmonary effort is normal. No respiratory distress.     Breath sounds: Normal breath sounds. No wheezing, rhonchi or rales.  Chest:     Chest wall: No tenderness or crepitus.  Abdominal:     General: Bowel sounds are normal. There is no distension.     Palpations: Abdomen is soft.     Tenderness: There is abdominal tenderness. There is no guarding or rebound.       Comments: Patient is tender diffusely in the upper abdomen he states it is worse in the epigastric area.  Musculoskeletal: Normal range of motion.        General: No tenderness.     Comments: Moves all extremities well.   Skin:    General: Skin is warm and dry.     Coloration: Skin is not pale.     Findings: No erythema or rash.  Neurological:     Mental Status: He is alert and oriented to person, place, and time.     Cranial Nerves: No cranial nerve deficit.  Psychiatric:        Mood and Affect: Mood is not anxious.        Speech: Speech normal.        Behavior: Behavior normal.      ED Treatments / Results  Labs (all labs ordered are listed, but only abnormal results are displayed) Results for orders placed or performed during the hospital encounter of 01/27/18  Comprehensive metabolic panel  Result Value Ref Range   Sodium 138 135 - 145 mmol/L   Potassium 3.6 3.5 - 5.1 mmol/L   Chloride 102 98 - 111 mmol/L   CO2 27 22 - 32 mmol/L   Glucose, Bld 130 (H) 70 - 99 mg/dL   BUN 22 (H) 6 - 20 mg/dL   Creatinine, Ser 1.61 (H) 0.61 - 1.24 mg/dL   Calcium 9.0 8.9 - 09.6 mg/dL   Total Protein 7.6 6.5 - 8.1 g/dL   Albumin 4.4 3.5 - 5.0 g/dL   AST 45 (H) 15 - 41 U/L   ALT 111 (H) 0 - 44 U/L   Alkaline Phosphatase 49 38 - 126 U/L   Total Bilirubin 1.1 0.3 - 1.2 mg/dL   GFR calc  non Af Amer >60 >60 mL/min   GFR calc Af Amer >60 >60 mL/min  Anion gap 9 5 - 15  Lipase, blood  Result Value Ref Range   Lipase 49 11 - 51 U/L  CBC with Differential  Result Value Ref Range   WBC 8.1 4.0 - 10.5 K/uL   RBC 5.55 4.22 - 5.81 MIL/uL   Hemoglobin 16.4 13.0 - 17.0 g/dL   HCT 38.148.8 82.939.0 - 93.752.0 %   MCV 87.9 80.0 - 100.0 fL   MCH 29.5 26.0 - 34.0 pg   MCHC 33.6 30.0 - 36.0 g/dL   RDW 16.912.8 67.811.5 - 93.815.5 %   Platelets 256 150 - 400 K/uL   nRBC 0.0 0.0 - 0.2 %   Neutrophils Relative % 58 %   Neutro Abs 4.8 1.7 - 7.7 K/uL   Lymphocytes Relative 30 %   Lymphs Abs 2.5 0.7 - 4.0 K/uL   Monocytes Relative 8 %   Monocytes Absolute 0.6 0.1 - 1.0 K/uL   Eosinophils Relative 2 %   Eosinophils Absolute 0.1 0.0 - 0.5 K/uL   Basophils Relative 1 %   Basophils Absolute 0.1 0.0 - 0.1 K/uL   Immature Granulocytes 1 %   Abs Immature Granulocytes 0.10 (H) 0.00 - 0.07 K/uL  Urinalysis, Routine w reflex microscopic  Result Value Ref Range   Color, Urine YELLOW YELLOW   APPearance CLEAR CLEAR   Specific Gravity, Urine 1.027 1.005 - 1.030   pH 7.0 5.0 - 8.0   Glucose, UA NEGATIVE NEGATIVE mg/dL   Hgb urine dipstick NEGATIVE NEGATIVE   Bilirubin Urine NEGATIVE NEGATIVE   Ketones, ur NEGATIVE NEGATIVE mg/dL   Protein, ur NEGATIVE NEGATIVE mg/dL   Nitrite NEGATIVE NEGATIVE   Leukocytes, UA NEGATIVE NEGATIVE   Laboratory interpretation all normal except renal insufficiency, elevation of the LFTs    EKG None  Radiology Ct Abdomen Pelvis W Contrast  Result Date: 01/27/2018 CLINICAL DATA:  Known gallstones. Tonight acute epigastric pain and nausea. EXAM: CT ABDOMEN AND PELVIS WITH CONTRAST TECHNIQUE: Multidetector CT imaging of the abdomen and pelvis was performed using the standard protocol following bolus administration of intravenous contrast. CONTRAST:  100mL ISOVUE-300 IOPAMIDOL (ISOVUE-300) INJECTION 61% COMPARISON:  None. FINDINGS: Lower chest: Lung bases are clear.  Hepatobiliary: Diffuse fatty infiltration of the liver. No focal lesions. Multiple gallstones. No gallbladder wall thickening or edema. No bile duct dilatation. Pancreas: Unremarkable. No pancreatic ductal dilatation or surrounding inflammatory changes. Spleen: Normal in size without focal abnormality. Adrenals/Urinary Tract: No adrenal gland nodules. Nephrograms are symmetrical. No hydronephrosis or hydroureter. There is a 6 mm diameter enhancing exophytic lesion arising off of the midpole right kidney. This could represent an adenoma. This is too small to characterize by size and follow-up in 1 year is suggested. Bladder is unremarkable. Stomach/Bowel: Stomach is within normal limits. Appendix appears normal. No evidence of bowel wall thickening, distention, or inflammatory changes. Vascular/Lymphatic: No significant vascular findings are present. No enlarged abdominal or pelvic lymph nodes. Reproductive: Prostate is unremarkable. Other: No abdominal wall hernia or abnormality. No abdominopelvic ascites. Musculoskeletal: No acute or significant osseous findings. IMPRESSION: 1. Diffuse fatty infiltration of the liver. 2. Cholelithiasis without changes of cholecystitis. 3. 6 mm indeterminate enhancing nodule on the right kidney. Follow-up in 1 year suggested. Electronically Signed   By: Burman NievesWilliam  Stevens M.D.   On: 01/27/2018 04:43    Procedures Procedures (including critical care time)  Medications Ordered in ED Medications  sodium chloride 0.9 % bolus 1,000 mL (0 mLs Intravenous Stopped 01/27/18 0536)  fentaNYL (SUBLIMAZE) injection 50 mcg (50 mcg Intravenous  Given 01/27/18 0351)  ondansetron (ZOFRAN) injection 4 mg (4 mg Intravenous Given 01/27/18 0351)  iopamidol (ISOVUE-300) 61 % injection 100 mL (100 mLs Intravenous Contrast Given 01/27/18 0418)  morphine 4 MG/ML injection 4 mg (4 mg Intravenous Given 01/27/18 0430)  cefTRIAXone (ROCEPHIN) 2 g in sodium chloride 0.9 % 100 mL IVPB (0 g Intravenous  Stopped 01/27/18 0626)  fentaNYL (SUBLIMAZE) injection 50 mcg (50 mcg Intravenous Given 01/27/18 0626)     Initial Impression / Assessment and Plan / ED Course  I have reviewed the triage vital signs and the nursing notes.  Pertinent labs & imaging results that were available during my care of the patient were reviewed by me and considered in my medical decision making (see chart for details).      Patient was given IV fluids, IV pain and nausea medicine.  Ultrasound is not available at night, CT of the abdomen was ordered.  Patient required additional pain medicine prior to going to CT scan.  5:10 AM after reviewing patient's CT results he was given Rocephin 2 g IV for possible cholecystitis.  Patient has minor elevation of LFTs, his CT does not show any acute findings to the gallbladder other other than having gallstones.  Unfortunately ultrasound is not available at this time.  I talked to the patient and his wife at 5:30 AM he states his pain is much better and rates it as a "2".  He states now he just has a mild achiness.  We discussed his CT results which did not for notably show acute cholecystitis.  We discussed waiting until ultrasound comes in the morning which will be around 730 in getting an ultrasound done at that time for better clarification.  He is agreeable.  8:20 AM patient waiting for his ultrasound, Dr. Adriana Simasook for follow-up.  Final Clinical Impressions(s) / ED Diagnoses   Final diagnoses:  Epigastric abdominal pain  Gallstones    Disposition pending  Devoria AlbeIva Ansleigh Safer, MD, Concha PyoFACEP    Jaegar Croft, MD 01/27/18 [phone]

## 2018-01-27 NOTE — ED Triage Notes (Signed)
Pt with known gallstones, states pain started tonight approx 6 hour ago with nausea, no vomiting or diarrhea.   Pain in right upper abd

## 2018-01-27 NOTE — ED Provider Notes (Signed)
Discussed test results with patient, wife, Dr. Lovell SheehanJenkins.  Patient will follow-up as an outpatient with general surgery.  He will return here over the weekend if worse.  Discharge medications Percocet and Zofran 8 mg.   Donnetta Hutchingook, Shemicka Cohrs, MD 01/27/18 1235

## 2018-01-31 ENCOUNTER — Encounter: Payer: Self-pay | Admitting: General Surgery

## 2018-01-31 ENCOUNTER — Ambulatory Visit: Payer: BLUE CROSS/BLUE SHIELD | Admitting: General Surgery

## 2018-01-31 ENCOUNTER — Encounter (HOSPITAL_COMMUNITY): Payer: Self-pay

## 2018-01-31 ENCOUNTER — Encounter (HOSPITAL_COMMUNITY)
Admission: RE | Admit: 2018-01-31 | Discharge: 2018-01-31 | Disposition: A | Discharge status: Home / Self Care | Payer: BLUE CROSS/BLUE SHIELD | Source: Ambulatory Visit | Attending: General Surgery | Admitting: General Surgery

## 2018-01-31 VITALS — BP 131/78 | HR 83 | Temp 97.8°F | Resp 18 | Wt 246.3 lb

## 2018-01-31 DIAGNOSIS — K8020 Calculus of gallbladder without cholecystitis without obstruction: Secondary | ICD-10-CM

## 2018-01-31 NOTE — Patient Instructions (Signed)
Laparoscopic Cholecystectomy Laparoscopic cholecystectomy is surgery to remove the gallbladder. The gallbladder is a pear-shaped organ that lies beneath the liver on the right side of the body. The gallbladder stores bile, which is a fluid that helps the body to digest fats. Cholecystectomy is often done for inflammation of the gallbladder (cholecystitis). This condition is usually caused by a buildup of gallstones (cholelithiasis) in the gallbladder. Gallstones can block the flow of bile, which can result in inflammation and pain. In severe cases, emergency surgery may be required. This procedure is done though small incisions in your abdomen (laparoscopic surgery). A thin scope with a camera (laparoscope) is inserted through one incision. Thin surgical instruments are inserted through the other incisions. In some cases, a laparoscopic procedure may be turned into a type of surgery that is done through a larger incision (open surgery). Tell a health care provider about:  Any allergies you have.  All medicines you are taking, including vitamins, herbs, eye drops, creams, and over-the-counter medicines.  Any problems you or family members have had with anesthetic medicines.  Any blood disorders you have.  Any surgeries you have had.  Any medical conditions you have.  Whether you are pregnant or may be pregnant. What are the risks? Generally, this is a safe procedure. However, problems may occur, including:  Infection.  Bleeding.  Allergic reactions to medicines.  Damage to other structures or organs.  A stone remaining in the common bile duct. The common bile duct carries bile from the gallbladder into the small intestine.  A bile leak from the cyst duct that is clipped when your gallbladder is removed.   Medicines  Ask your health care provider about: ? Changing or stopping your regular medicines. This is especially important if you are taking diabetes medicines or blood  thinners. ? Taking medicines such as aspirin and ibuprofen. These medicines can thin your blood. Do not take these medicines before your procedure if your health care provider instructs you not to.  You may be given antibiotic medicine to help prevent infection. General instructions  Let your health care provider know if you develop a cold or an infection before surgery.  Plan to have someone take you home from the hospital or clinic.  Ask your health care provider how your surgical site will be marked or identified. What happens during the procedure?   To reduce your risk of infection: ? Your health care team will wash or sanitize their hands. ? Your skin will be washed with soap. ? Hair may be removed from the surgical area.  An IV tube may be inserted into one of your veins.  You will be given one or more of the following: ? A medicine to help you relax (sedative). ? A medicine to make you fall asleep (general anesthetic).  A breathing tube will be placed in your mouth.  Your surgeon will make several small cuts (incisions) in your abdomen.  The laparoscope will be inserted through one of the small incisions. The camera on the laparoscope will send images to a TV screen (monitor) in the operating room. This lets your surgeon see inside your abdomen.  Air-like gas will be pumped into your abdomen. This will expand your abdomen to give the surgeon more room to perform the surgery.  Other tools that are needed for the procedure will be inserted through the other incisions. The gallbladder will be removed through one of the incisions.  Your common bile duct may be examined. If stones   are found in the common bile duct, they may be removed.  After your gallbladder has been removed, the incisions will be closed with stitches (sutures), staples, or skin glue.  Your incisions may be covered with a bandage (dressing). The procedure may vary among health care providers and  hospitals. What happens after the procedure?  Your blood pressure, heart rate, breathing rate, and blood oxygen level will be monitored until the medicines you were given have worn off.  You will be given medicines as needed to control your pain.  Do not drive for 24 hours if you were given a sedative. This information is not intended to replace advice given to you by your health care provider. Make sure you discuss any questions you have with your health care provider. Document Released: 01/25/2005 Document Revised: 12/23/2016 Document Reviewed: 07/14/2015 Elsevier Interactive Patient Education  2019 [employer]vier Inc.  

## 2018-01-31 NOTE — H&P (Signed)
Barry PlowmanJeremy L Robertson; [id number]; [birth year]   HPI Is a 37 year old white male who was referred to my care by the emergency room and Dr. Nita SellsJohn Hall for evaluation treatment of biliary colic secondary to cholelithiasis.  Patient has had a known history of gallstones.  He states that intermittently he does have right upper quadrant abdominal pain which radiates to the right flank, nausea, and worsening heartburn.  He has a history of GERD.  He denies any fever, chills, or jaundice.  He was seen in the emergency room several days ago due to a biliary colic attack which did not resolve.  Ultrasound the gallbladder confirmed cholelithiasis with a normal common bile duct.  He currently has 0 out of 10 abdominal pain.  He denies any fatty food intolerance.  He states his attacks seem to be coming more frequently. Past Medical History:  Diagnosis Date  . Gallstones   . Gout     Past Surgical History:  Procedure Laterality Date  . BIOPSY  11/05/2016   Procedure: BIOPSY;  Surgeon: Malissa Hippoehman, Najeeb U, MD;  Location: [facility];  Service: Endoscopy;;  esophagus antrum  . ESOPHAGOGASTRODUODENOSCOPY N/A 11/05/2016   Procedure: ESOPHAGOGASTRODUODENOSCOPY (EGD);  Surgeon: Malissa Hippoehman, Najeeb U, MD;  Location: [facility];  Service: Endoscopy;  Laterality: N/A;  10:50  . WRIST SURGERY Left     History reviewed. No pertinent family history.  Current Outpatient Medications on File Prior to Visit  Medication Sig Dispense Refill  . famotidine (PEPCID) 20 MG tablet Take 1 tablet (20 mg total) by mouth at bedtime.    . pantoprazole (PROTONIX) 40 MG tablet TAKE 1 TABLET BY MOUTH TWICE DAILY BEFOREA MEAL 60 tablet 3   No current facility-administered medications on file prior to visit.     No Known Allergies  Social History   Substance and Sexual Activity  Alcohol Use Yes   Comment: rarely    Social History   Tobacco Use  Smoking Status Never Smoker  Smokeless Tobacco Never Used    Review of Systems   Constitutional: Negative.   HENT: Negative.   Eyes: Negative.   Cardiovascular: Negative.   Gastrointestinal: Positive for abdominal pain, heartburn and nausea.  Genitourinary: Negative.   Musculoskeletal: Negative.   Skin: Negative.   Neurological: Negative.   Endo/Heme/Allergies: Negative.   Psychiatric/Behavioral: Negative.     Objective   Vitals:   01/31/18 0930  BP: 131/78  Pulse: 83  Resp: 18  Temp: 97.8 F (36.6 C)    Physical Exam Vitals signs reviewed.  Constitutional:      Appearance: He is well-developed. He is not ill-appearing.  HENT:     Head: Normocephalic and atraumatic.  Eyes:     General: No scleral icterus. Cardiovascular:     Rate and Rhythm: Normal rate and regular rhythm.     Heart sounds: Normal heart sounds. No murmur. No gallop.   Pulmonary:     Effort: Pulmonary effort is normal. No respiratory distress.     Breath sounds: Normal breath sounds. No stridor. No wheezing, rhonchi or rales.  Abdominal:     General: Abdomen is flat. Bowel sounds are normal. There is no distension.     Palpations: Abdomen is soft. There is no hepatomegaly.     Tenderness: There is no abdominal tenderness. Negative signs include Murphy's sign.  Skin:    General: Skin is warm and dry.  Neurological:     Mental Status: He is alert and oriented to person, place, and time.  ER notes reviewed Assessment  Biliary colic, cholelithiasis Plan   Patient is scheduled for laparoscopic cholecystectomy on 02/03/2018.  The risks and benefits of the procedure including bleeding, infection, hepatobiliary injury, and the possibility of an open procedure were fully explained to the patient, who gave informed consent. 

## 2018-01-31 NOTE — Progress Notes (Signed)
Barry Robertson; [id number]; [birth year]   HPI Is a 37 year old white male who was referred to my care by the emergency room and Dr. Nita SellsJohn Robertson for evaluation treatment of biliary colic secondary to cholelithiasis.  Patient has had a known history of gallstones.  He states that intermittently he does have right upper quadrant abdominal pain which radiates to the right flank, nausea, and worsening heartburn.  He has a history of GERD.  He denies any fever, chills, or jaundice.  He was seen in the emergency room several days ago due to a biliary colic attack which did not resolve.  Ultrasound the gallbladder confirmed cholelithiasis with a normal common bile duct.  He currently has 0 out of 10 abdominal pain.  He denies any fatty food intolerance.  He states his attacks seem to be coming more frequently. Past Medical History:  Diagnosis Date  . Gallstones   . Gout     Past Surgical History:  Procedure Laterality Date  . BIOPSY  11/05/2016   Procedure: BIOPSY;  Surgeon: Barry Robertson, Barry U, MD;  Location: [facility];  Service: Endoscopy;;  esophagus antrum  . ESOPHAGOGASTRODUODENOSCOPY N/A 11/05/2016   Procedure: ESOPHAGOGASTRODUODENOSCOPY (EGD);  Surgeon: Barry Robertson, Barry U, MD;  Location: [facility];  Service: Endoscopy;  Laterality: N/A;  10:50  . WRIST SURGERY Left     History reviewed. No pertinent family history.  Current Outpatient Medications on File Prior to Visit  Medication Sig Dispense Refill  . famotidine (PEPCID) 20 MG tablet Take 1 tablet (20 mg total) by mouth at bedtime.    . pantoprazole (PROTONIX) 40 MG tablet TAKE 1 TABLET BY MOUTH TWICE DAILY BEFOREA MEAL 60 tablet 3   No current facility-administered medications on file prior to visit.     No Known Allergies  Social History   Substance and Sexual Activity  Alcohol Use Yes   Comment: rarely    Social History   Tobacco Use  Smoking Status Never Smoker  Smokeless Tobacco Never Used    Review of Systems   Constitutional: Negative.   HENT: Negative.   Eyes: Negative.   Cardiovascular: Negative.   Gastrointestinal: Positive for abdominal pain, heartburn and nausea.  Genitourinary: Negative.   Musculoskeletal: Negative.   Skin: Negative.   Neurological: Negative.   Endo/Heme/Allergies: Negative.   Psychiatric/Behavioral: Negative.     Objective   Vitals:   01/31/18 0930  BP: 131/78  Pulse: 83  Resp: 18  Temp: 97.8 F (36.6 C)    Physical Exam Vitals signs reviewed.  Constitutional:      Appearance: He is well-developed. He is not ill-appearing.  HENT:     Head: Normocephalic and atraumatic.  Eyes:     General: No scleral icterus. Cardiovascular:     Rate and Rhythm: Normal rate and regular rhythm.     Heart sounds: Normal heart sounds. No murmur. No gallop.   Pulmonary:     Effort: Pulmonary effort is normal. No respiratory distress.     Breath sounds: Normal breath sounds. No stridor. No wheezing, rhonchi or rales.  Abdominal:     General: Abdomen is flat. Bowel sounds are normal. There is no distension.     Palpations: Abdomen is soft. There is no hepatomegaly.     Tenderness: There is no abdominal tenderness. Negative signs include Murphy's sign.  Skin:    General: Skin is warm and dry.  Neurological:     Mental Status: He is alert and oriented to person, place, and time.  ER notes reviewed Assessment  Biliary colic, cholelithiasis Plan   Patient is scheduled for laparoscopic cholecystectomy on 02/03/2018.  The risks and benefits of the procedure including bleeding, infection, hepatobiliary injury, and the possibility of an open procedure were fully explained to the patient, who gave informed consent.

## 2018-02-03 ENCOUNTER — Ambulatory Visit (HOSPITAL_COMMUNITY)
Admission: RE | Admit: 2018-02-03 | Discharge: 2018-02-03 | Disposition: A | Discharge status: Home / Self Care | Payer: BLUE CROSS/BLUE SHIELD | Attending: General Surgery | Admitting: General Surgery

## 2018-02-03 ENCOUNTER — Encounter (HOSPITAL_COMMUNITY)
Admission: RE | Disposition: A | Discharge status: Home / Self Care | Payer: Self-pay | Source: Home / Self Care | Attending: General Surgery

## 2018-02-03 ENCOUNTER — Encounter (HOSPITAL_COMMUNITY): Payer: Self-pay

## 2018-02-03 ENCOUNTER — Ambulatory Visit (HOSPITAL_COMMUNITY): Payer: BLUE CROSS/BLUE SHIELD | Admitting: Anesthesiology

## 2018-02-03 DIAGNOSIS — K8020 Calculus of gallbladder without cholecystitis without obstruction: Secondary | ICD-10-CM

## 2018-02-03 DIAGNOSIS — R1084 Generalized abdominal pain: Secondary | ICD-10-CM | POA: Diagnosis present

## 2018-02-03 DIAGNOSIS — K8000 Calculus of gallbladder with acute cholecystitis without obstruction: Secondary | ICD-10-CM | POA: Diagnosis not present

## 2018-02-03 DIAGNOSIS — K219 Gastro-esophageal reflux disease without esophagitis: Secondary | ICD-10-CM | POA: Diagnosis not present

## 2018-02-03 HISTORY — PX: CHOLECYSTECTOMY: SHX55

## 2018-02-03 SURGERY — LAPAROSCOPIC CHOLECYSTECTOMY
Anesthesia: General | Site: Abdomen

## 2018-02-03 MED ORDER — MIDAZOLAM HCL 2 MG/2ML IJ SOLN: 0.5000 mg | Freq: Once | INTRAMUSCULAR | Status: DC | PRN

## 2018-02-03 MED ORDER — CIPROFLOXACIN IN D5W 400 MG/200ML IV SOLN
400.0000 mg | INTRAVENOUS | Status: AC
  Administered 2018-02-03 (×2): 400 mg via INTRAVENOUS
  Filled 2018-02-03: qty 200

## 2018-02-03 MED ORDER — CHLORHEXIDINE GLUCONATE CLOTH 2 % EX PADS: 6.0000 | MEDICATED_PAD | Freq: Once | CUTANEOUS | Status: DC

## 2018-02-03 MED ORDER — ROCURONIUM BROMIDE 100 MG/10ML IV SOLN
INTRAVENOUS | Status: DC | PRN
  Administered 2018-02-03 (×2): 10 mg via INTRAVENOUS
  Administered 2018-02-03: 30 mg via INTRAVENOUS
  Administered 2018-02-03: 5 mg via INTRAVENOUS

## 2018-02-03 MED ORDER — POVIDONE-IODINE 10 % OINT PACKET
TOPICAL_OINTMENT | CUTANEOUS | Status: DC | PRN
  Administered 2018-02-03: 1 via TOPICAL

## 2018-02-03 MED ORDER — SUGAMMADEX SODIUM 500 MG/5ML IV SOLN
INTRAVENOUS | Status: AC
  Filled 2018-02-03: qty 5

## 2018-02-03 MED ORDER — SUCCINYLCHOLINE CHLORIDE 20 MG/ML IJ SOLN
INTRAMUSCULAR | Status: DC | PRN
  Administered 2018-02-03: 180 mg via INTRAVENOUS

## 2018-02-03 MED ORDER — HEMOSTATIC AGENTS (NO CHARGE) OPTIME
TOPICAL | Status: DC | PRN
  Administered 2018-02-03: 1 via TOPICAL

## 2018-02-03 MED ORDER — POVIDONE-IODINE 10 % EX OINT
TOPICAL_OINTMENT | CUTANEOUS | Status: AC
  Filled 2018-02-03: qty 1

## 2018-02-03 MED ORDER — KETOROLAC TROMETHAMINE 30 MG/ML IJ SOLN
30.0000 mg | Freq: Once | INTRAMUSCULAR | Status: AC
  Administered 2018-02-03: 30 mg via INTRAVENOUS
  Filled 2018-02-03: qty 1

## 2018-02-03 MED ORDER — FENTANYL CITRATE (PF) 250 MCG/5ML IJ SOLN
INTRAMUSCULAR | Status: AC
  Filled 2018-02-03: qty 5

## 2018-02-03 MED ORDER — MIDAZOLAM HCL 2 MG/2ML IJ SOLN
INTRAMUSCULAR | Status: AC
  Filled 2018-02-03: qty 2

## 2018-02-03 MED ORDER — BUPIVACAINE LIPOSOME 1.3 % IJ SUSP
INTRAMUSCULAR | Status: AC
  Filled 2018-02-03: qty 20

## 2018-02-03 MED ORDER — PROPOFOL 10 MG/ML IV BOLUS
INTRAVENOUS | Status: DC | PRN
  Administered 2018-02-03: 200 mg via INTRAVENOUS

## 2018-02-03 MED ORDER — ROCURONIUM BROMIDE 10 MG/ML (PF) SYRINGE
PREFILLED_SYRINGE | INTRAVENOUS | Status: AC
  Filled 2018-02-03: qty 10

## 2018-02-03 MED ORDER — SODIUM CHLORIDE 0.9 % IR SOLN
Status: DC | PRN
  Administered 2018-02-03: 3000 mL
  Administered 2018-02-03: 1000 mL

## 2018-02-03 MED ORDER — SUGAMMADEX SODIUM 500 MG/5ML IV SOLN
INTRAVENOUS | Status: DC | PRN
  Administered 2018-02-03: 250 mg via INTRAVENOUS

## 2018-02-03 MED ORDER — ONDANSETRON HCL 4 MG/2ML IJ SOLN
INTRAMUSCULAR | Status: DC | PRN
  Administered 2018-02-03: 4 mg via INTRAVENOUS

## 2018-02-03 MED ORDER — FENTANYL CITRATE (PF) 100 MCG/2ML IJ SOLN
INTRAMUSCULAR | Status: DC | PRN
  Administered 2018-02-03: 100 ug via INTRAVENOUS

## 2018-02-03 MED ORDER — SUCCINYLCHOLINE CHLORIDE 200 MG/10ML IV SOSY
PREFILLED_SYRINGE | INTRAVENOUS | Status: AC
  Filled 2018-02-03: qty 10

## 2018-02-03 MED ORDER — OXYCODONE-ACETAMINOPHEN 7.5-325 MG PO TABS: 1.0000 | ORAL_TABLET | Freq: Four times a day (QID) | ORAL | 0 refills | Status: DC | PRN

## 2018-02-03 MED ORDER — BUPIVACAINE LIPOSOME 1.3 % IJ SUSP
INTRAMUSCULAR | Status: DC | PRN
  Administered 2018-02-03: 20 mL

## 2018-02-03 MED ORDER — MIDAZOLAM HCL 5 MG/5ML IJ SOLN
INTRAMUSCULAR | Status: DC | PRN
  Administered 2018-02-03: 2 mg via INTRAVENOUS

## 2018-02-03 MED ORDER — LIDOCAINE 2% (20 MG/ML) 5 ML SYRINGE
INTRAMUSCULAR | Status: AC
  Filled 2018-02-03: qty 5

## 2018-02-03 MED ORDER — HYDROCODONE-ACETAMINOPHEN 7.5-325 MG PO TABS: 1.0000 | ORAL_TABLET | Freq: Once | ORAL | Status: DC | PRN

## 2018-02-03 MED ORDER — PROMETHAZINE HCL 25 MG/ML IJ SOLN: 6.2500 mg | INTRAMUSCULAR | Status: DC | PRN

## 2018-02-03 MED ORDER — HYDROMORPHONE HCL 1 MG/ML IJ SOLN: 0.2500 mg | INTRAMUSCULAR | Status: DC | PRN

## 2018-02-03 MED ORDER — LACTATED RINGERS IV SOLN
INTRAVENOUS | Status: DC
  Administered 2018-02-03 (×2): via INTRAVENOUS

## 2018-02-03 SURGICAL SUPPLY — 48 items
APPLICATOR ARISTA FLEXITIP XL (MISCELLANEOUS) ×3 IMPLANT
APPLIER CLIP ROT 10 11.4 M/L (STAPLE) ×3
BAG RETRIEVAL 10 (BASKET) ×1
BAG RETRIEVAL 10MM (BASKET) ×1
CLIP APPLIE ROT 10 11.4 M/L (STAPLE) ×1 IMPLANT
CLOTH BEACON ORANGE TIMEOUT ST (SAFETY) ×3 IMPLANT
COVER LIGHT HANDLE STERIS (MISCELLANEOUS) ×6 IMPLANT
COVER WAND RF STERILE (DRAPES) ×3 IMPLANT
CUTTER FLEX LINEAR 45M (STAPLE) ×3 IMPLANT
ELECT REM PT RETURN 9FT ADLT (ELECTROSURGICAL) ×3
ELECTRODE REM PT RTRN 9FT ADLT (ELECTROSURGICAL) ×1 IMPLANT
FILTER SMOKE EVAC LAPAROSHD (FILTER) ×3 IMPLANT
GLOVE BIOGEL PI IND STRL 7.0 (GLOVE) ×2 IMPLANT
GLOVE BIOGEL PI INDICATOR 7.0 (GLOVE) ×4
GLOVE SURG SS PI 7.5 STRL IVOR (GLOVE) ×3 IMPLANT
GOWN STRL REUS W/ TWL XL LVL3 (GOWN DISPOSABLE) ×1 IMPLANT
GOWN STRL REUS W/TWL LRG LVL3 (GOWN DISPOSABLE) ×6 IMPLANT
GOWN STRL REUS W/TWL XL LVL3 (GOWN DISPOSABLE) ×2
HEMOSTAT ARISTA ABSORB 3G PWDR (MISCELLANEOUS) ×3 IMPLANT
HEMOSTAT SNOW SURGICEL 2X4 (HEMOSTASIS) ×3 IMPLANT
INST SET LAPROSCOPIC AP (KITS) ×3 IMPLANT
IV NS IRRIG 3000ML ARTHROMATIC (IV SOLUTION) ×3 IMPLANT
KIT TURNOVER KIT A (KITS) ×3 IMPLANT
MANIFOLD NEPTUNE II (INSTRUMENTS) ×3 IMPLANT
NEEDLE HYPO 18GX1.5 BLUNT FILL (NEEDLE) ×3 IMPLANT
NEEDLE HYPO 22GX1.5 SAFETY (NEEDLE) ×3 IMPLANT
NEEDLE INSUFFLATION 14GA 120MM (NEEDLE) ×3 IMPLANT
NS IRRIG 1000ML POUR BTL (IV SOLUTION) ×3 IMPLANT
PACK LAP CHOLE LZT030E (CUSTOM PROCEDURE TRAY) ×3 IMPLANT
PAD ARMBOARD 7.5X6 YLW CONV (MISCELLANEOUS) ×3 IMPLANT
RELOAD 45 VASCULAR/THIN (ENDOMECHANICALS) ×3 IMPLANT
SET BASIN LINEN APH (SET/KITS/TRAYS/PACK) ×3 IMPLANT
SET TUBE IRRIG SUCTION NO TIP (IRRIGATION / IRRIGATOR) ×3 IMPLANT
SLEEVE ENDOPATH XCEL 5M (ENDOMECHANICALS) ×3 IMPLANT
SPONGE GAUZE 2X2 8PLY STER LF (GAUZE/BANDAGES/DRESSINGS) ×4
SPONGE GAUZE 2X2 8PLY STRL LF (GAUZE/BANDAGES/DRESSINGS) ×8 IMPLANT
STAPLER VISISTAT (STAPLE) ×3 IMPLANT
SUT VICRYL 0 UR6 27IN ABS (SUTURE) ×6 IMPLANT
SYR 20CC LL (SYRINGE) ×3 IMPLANT
SYS BAG RETRIEVAL 10MM (BASKET) ×1
SYSTEM BAG RETRIEVAL 10MM (BASKET) ×1 IMPLANT
TROCAR ENDO BLADELESS 11MM (ENDOMECHANICALS) ×3 IMPLANT
TROCAR XCEL NON-BLD 5MMX100MML (ENDOMECHANICALS) ×3 IMPLANT
TROCAR XCEL UNIV SLVE 11M 100M (ENDOMECHANICALS) ×3 IMPLANT
TUBE CONNECTING 12'X1/4 (SUCTIONS) ×1
TUBE CONNECTING 12X1/4 (SUCTIONS) ×2 IMPLANT
TUBING INSUFFLATION (TUBING) ×3 IMPLANT
WARMER LAPAROSCOPE (MISCELLANEOUS) ×3 IMPLANT

## 2018-02-03 NOTE — Op Note (Signed)
Patient:  Barry Robertson  DOB: Quinn Plowman 04-17-1980  MRN:  [id number]   Preop Diagnosis: Biliary colic, cholelithiasis  Postop Diagnosis: Same  Procedure: Laparoscopic cholecystectomy  Surgeon: Franky MachoMark Kordelia Severin, MD  Anes: General endotracheal  Indications: Patient is a 37 year old white male who presents with biliary colic secondary to cholelithiasis.  The risks and benefits of the procedure including bleeding, infection, hepatobiliary injury, and the possibility of an open procedure were fully explained to the patient, who gave informed consent.  Procedure note: The patient was placed in supine position.  After induction of general endotracheal anesthesia, the abdomen was prepped and draped using the usual sterile technique with DuraPrep.  Surgical site confirmation was performed.  A supraumbilical incision was made down to the fascia.  A Veress needle was introduced into the abdominal cavity and confirmation of placement was done using the saline drop test.  The abdomen was then insufflated to 16 mmHg pressure.  An 11 mm trocar was introduced into the abdominal cavity under direct visualization without difficulty.  The patient was placed in reverse Trendelenburg position and an additional 11millimeter trocar was placed in the epigastric region and 5 mm trochars were placed in the right upper quadrant and right flank regions.  Liver was inspected noted to be within normal limits.  The gallbladder was noted to have chronic thickening of the gallbladder wall.  The gallbladder was retracted in a dynamic fashion in order to provide a critical view of the triangle of Calot.  The cystic artery was first identified.  The cystic duct was also fully identified.  Its juncture to the infundibulum fully identified.  Due to the chronic inflammatory nature of the gallbladder, the cystic duct was divided using a vascular Endo GIA.  The cystic artery was ligated divided using small clips.  The gallbladder was then freed  away from the gallbladder fossa using Bovie electrocautery.  The gallbladder was delivered through the epigastric trocar site using an Endo Catch bag.  The gallbladder fossa was inspected and no abnormal bleeding or bile leakage was noted.  Surgicel was placed in the gallbladder fossa.  All fluid and air were then evacuated from the abdominal cavity prior to the removal of the trochars.  All wounds were irrigated with normal saline.  All wounds were injected with Exparel.  The supraumbilical fascia as well as epigastric fascia were reapproximated using 0 Vicryl interrupted sutures.  All skin incisions were closed using staples.  Betadine ointment and dry sterile dressings were applied.  All tape and needle counts were correct at the end of the procedure.  The patient was extubated in the operating room and transferred to PACU in stable condition.  Complications: None  EBL: Minimal  Specimen: Gallbladder

## 2018-02-03 NOTE — Discharge Instructions (Signed)
Laparoscopic Cholecystectomy, Care After °This sheet gives you information about how to care for yourself after your procedure. Your health care provider may also give you more specific instructions. If you have problems or questions, contact your health care provider. °What can I expect after the procedure? °After the procedure, it is common to have: °· Pain at your incision sites. You will be given medicines to control this pain. °· Mild nausea or vomiting. °· Bloating and possible shoulder pain from the air-like gas that was used during the procedure. °Follow these instructions at home: °Incision care ° °· Follow instructions from your health care provider about how to take care of your incisions. Make sure you: °? Wash your hands with soap and water before you change your bandage (dressing). If soap and water are not available, use hand sanitizer. °? Change your dressing as told by your health care provider. °? Leave stitches (sutures), skin glue, or adhesive strips in place. These skin closures may need to be in place for 2 weeks or longer. If adhesive strip edges start to loosen and curl up, you may trim the loose edges. Do not remove adhesive strips completely unless your health care provider tells you to do that. °· Do not take baths, swim, or use a hot tub until your health care provider approves. Ask your health care provider if you can take showers. You may only be allowed to take sponge baths for bathing. °· Check your incision area every day for signs of infection. Check for: °? More redness, swelling, or pain. °? More fluid or blood. °? Warmth. °? Pus or a bad smell. °Activity °· Do not drive or use heavy machinery while taking prescription pain medicine. °· Do not lift anything that is heavier than 10 lb (4.5 kg) until your health care provider approves. °· Do not play contact sports until your health care provider approves. °· Do not drive for 24 hours if you were given a medicine to help you relax  (sedative). °· Rest as needed. Do not return to work or school until your health care provider approves. °General instructions °· Take over-the-counter and prescription medicines only as told by your health care provider. °· To prevent or treat constipation while you are taking prescription pain medicine, your health care provider may recommend that you: °? Drink enough fluid to keep your urine clear or pale yellow. °? Take over-the-counter or prescription medicines. °? Eat foods that are high in fiber, such as fresh fruits and vegetables, whole grains, and beans. °? Limit foods that are high in fat and processed sugars, such as fried and sweet foods. °Contact a health care provider if: °· You develop a rash. °· You have more redness, swelling, or pain around your incisions. °· You have more fluid or blood coming from your incisions. °· Your incisions feel warm to the touch. °· You have pus or a bad smell coming from your incisions. °· You have a fever. °· One or more of your incisions breaks open. °Get help right away if: °· You have trouble breathing. °· You have chest pain. °· You have increasing pain in your shoulders. °· You faint or feel dizzy when you stand. °· You have severe pain in your abdomen. °· You have nausea or vomiting that lasts for more than one day. °· You have leg pain. °This information is not intended to replace advice given to you by your health care provider. Make sure you discuss any questions you have   with your health care provider. °Document Released: 01/25/2005 Document Revised: 08/16/2015 Document Reviewed: 07/14/2015 °Elsevier Interactive Patient Education © 2019 [employer] ° °General Anesthesia, Adult, Care After °This sheet gives you information about how to care for yourself after your procedure. Your health care provider may also give you more specific instructions. If you have problems or questions, contact your health care provider. °What can I expect after the  procedure? °After the procedure, the following side effects are common: °· Pain or discomfort at the IV site. °· Nausea. °· Vomiting. °· Sore throat. °· Trouble concentrating. °· Feeling cold or chills. °· Weak or tired. °· Sleepiness and fatigue. °· Soreness and body aches. These side effects can affect parts of the body that were not involved in surgery. °Follow these instructions at home: ° °For at least 24 hours after the procedure: °· Have a responsible adult stay with you. It is important to have someone help care for you until you are awake and alert. °· Rest as needed. °· Do not: °? Participate in activities in which you could fall or become injured. °? Drive. °? Use heavy machinery. °? Drink alcohol. °? Take sleeping pills or medicines that cause drowsiness. °? Make important decisions or sign legal documents. °? Take care of children on your own. °Eating and drinking °· Follow any instructions from your health care provider about eating or drinking restrictions. °· When you feel hungry, start by eating small amounts of foods that are soft and easy to digest (bland), such as toast. Gradually return to your regular diet. °· Drink enough fluid to keep your urine pale yellow. °· If you vomit, rehydrate by drinking water, juice, or clear broth. °General instructions °· If you have sleep apnea, surgery and certain medicines can increase your risk for breathing problems. Follow instructions from your health care provider about wearing your sleep device: °? Anytime you are sleeping, including during daytime naps. °? While taking prescription pain medicines, sleeping medicines, or medicines that make you drowsy. °· Return to your normal activities as told by your health care provider. Ask your health care provider what activities are safe for you. °· Take over-the-counter and prescription medicines only as told by your health care provider. °· If you smoke, do not smoke without supervision. °· Keep all follow-up  visits as told by your health care provider. This is important. °Contact a health care provider if: °· You have nausea or vomiting that does not get better with medicine. °· You cannot eat or drink without vomiting. °· You have pain that does not get better with medicine. °· You are unable to pass urine. °· You develop a skin rash. °· You have a fever. °· You have redness around your IV site that gets worse. °Get help right away if: °· You have difficulty breathing. °· You have chest pain. °· You have blood in your urine or stool, or you vomit blood. °Summary °· After the procedure, it is common to have a sore throat or nausea. It is also common to feel tired. °· Have a responsible adult stay with you for the first 24 hours after general anesthesia. It is important to have someone help care for you until you are awake and alert. °· When you feel hungry, start by eating small amounts of foods that are soft and easy to digest (bland), such as toast. Gradually return to your regular diet. °· Drink enough fluid to keep your urine pale yellow. °· Return to your   normal activities as told by your health care provider. Ask your health care provider what activities are safe for you. °This information is not intended to replace advice given to you by your health care provider. Make sure you discuss any questions you have with your health care provider. °Document Released: 05/03/2000 Document Revised: 09/10/2016 Document Reviewed: 09/10/2016 °Elsevier Interactive Patient Education © 2019 [employer] ° °

## 2018-02-03 NOTE — Anesthesia Procedure Notes (Signed)
Procedure Name: Intubation Date/Time: 02/03/2018 8:56 AM Performed by: Charmaine Downs, CRNA Pre-anesthesia Checklist: Patient identified, Patient being monitored, Timeout performed, Emergency Drugs available and Suction available Patient Re-evaluated:Patient Re-evaluated prior to induction Oxygen Delivery Method: Circle System Utilized Preoxygenation: Pre-oxygenation with 100% oxygen Induction Type: IV induction Ventilation: Mask ventilation without difficulty Laryngoscope Size: Mac and 3 Grade View: Grade II Tube type: Oral Tube size: 8.0 mm Number of attempts: 1 Airway Equipment and Method: stylet Placement Confirmation: ETT inserted through vocal cords under direct vision,  positive ETCO2 and breath sounds checked- equal and bilateral Secured at: 23 cm Tube secured with: Tape Dental Injury: Teeth and Oropharynx as per pre-operative assessment

## 2018-02-03 NOTE — Anesthesia Postprocedure Evaluation (Signed)
Anesthesia Post Note  Patient: Barry PlowmanJeremy L Kadow  Procedure(s) Performed: LAPAROSCOPIC CHOLECYSTECTOMY (N/A Abdomen)  Patient location during evaluation: Short Stay Anesthesia Type: General Level of consciousness: awake and alert and patient cooperative Pain management: satisfactory to patient Vital Signs Assessment: post-procedure vital signs reviewed and stable Respiratory status: spontaneous breathing Cardiovascular status: stable Postop Assessment: no apparent nausea or vomiting Anesthetic complications: no     Last Vitals:  Vitals:   02/03/18 1045 02/03/18 1100  BP: 116/71 133/83  Pulse: 69 79  Resp: 17 13  Temp:    SpO2: 92%     Last Pain:  Vitals:   02/03/18 1100  TempSrc:   PainSc: 2                  Elye Harmsen

## 2018-02-03 NOTE — Transfer of Care (Signed)
Immediate Anesthesia Transfer of Care Note  Patient: Barry PlowmanJeremy L Robertson  Procedure(s) Performed: LAPAROSCOPIC CHOLECYSTECTOMY (N/A Abdomen)  Patient Location: PACU  Anesthesia Type:General  Level of Consciousness: awake and patient cooperative  Airway & Oxygen Therapy: Patient Spontanous Breathing and Patient connected to nasal cannula oxygen  Post-op Assessment: Report given to RN, Post -op Vital signs reviewed and stable and Patient moving all extremities  Post vital signs: Reviewed and stable  Last Vitals:  Vitals Value Taken Time  BP    Temp    Pulse 78 02/03/2018 10:16 AM  Resp    SpO2 92 % 02/03/2018 10:16 AM  Vitals shown include unvalidated device data.  Last Pain:  Vitals:   02/03/18 0729  TempSrc: Oral  PainSc: 0-No pain      Patients Stated Pain Goal: 7 (02/03/18 0729)  Complications: No apparent anesthesia complications

## 2018-02-03 NOTE — Anesthesia Preprocedure Evaluation (Signed)
Anesthesia Evaluation  Patient identified by MRN, date of birth, ID band Patient awake    Reviewed: Allergy & Precautions, NPO status , Patient's Chart, lab work & pertinent test results  Airway Mallampati: I  TM Distance: >3 FB Neck ROM: Full    Dental no notable dental hx. (+) Teeth Intact   Pulmonary neg pulmonary ROS,    Pulmonary exam normal breath sounds clear to auscultation       Cardiovascular Exercise Tolerance: Good negative cardio ROS Normal cardiovascular examI Rhythm:Regular Rate:Normal     Neuro/Psych negative neurological ROS  negative psych ROS   GI/Hepatic Neg liver ROS, GERD  Medicated and Controlled,  Endo/Other  negative endocrine ROS  Renal/GU negative Renal ROS  negative genitourinary   Musculoskeletal negative musculoskeletal ROS (+)   Abdominal   Peds negative pediatric ROS (+)  Hematology negative hematology ROS (+)   Anesthesia Other Findings   Reproductive/Obstetrics negative OB ROS                             Anesthesia Physical Anesthesia Plan  ASA: II  Anesthesia Plan: General   Post-op Pain Management:    Induction: Intravenous  PONV Risk Score and Plan:   Airway Management Planned: Oral ETT  Additional Equipment:   Intra-op Plan:   Post-operative Plan: Extubation in OR  Informed Consent: I have reviewed the patients History and Physical, chart, labs and discussed the procedure including the risks, benefits and alternatives for the proposed anesthesia with the patient or authorized representative who has indicated his/her understanding and acceptance.   Dental advisory given  Plan Discussed with: CRNA  Anesthesia Plan Comments:         Anesthesia Quick Evaluation

## 2018-02-03 NOTE — Interval H&P Note (Signed)
History and Physical Interval Note:  02/03/2018 8:02 AM  Barry Robertson  has presented today for surgery, with the diagnosis of cholelithiasis  The various methods of treatment have been discussed with the patient and family. After consideration of risks, benefits and other options for treatment, the patient has consented to  Procedure(s): LAPAROSCOPIC CHOLECYSTECTOMY (N/A) as a surgical intervention .  The patient's history has been reviewed, patient examined, no change in status, stable for surgery.  I have reviewed the patient's chart and labs.  Questions were answered to the patient's satisfaction.     [name]

## 2018-02-06 ENCOUNTER — Encounter (HOSPITAL_COMMUNITY): Payer: Self-pay | Admitting: General Surgery

## 2018-02-14 ENCOUNTER — Encounter: Payer: Self-pay | Admitting: General Surgery

## 2018-02-14 ENCOUNTER — Ambulatory Visit (INDEPENDENT_AMBULATORY_CARE_PROVIDER_SITE_OTHER): Payer: Self-pay | Admitting: General Surgery

## 2018-02-14 VITALS — BP 134/80 | HR 87 | Temp 97.7°F | Resp 18 | Wt 241.6 lb

## 2018-02-14 DIAGNOSIS — Z09 Encounter for follow-up examination after completed treatment for conditions other than malignant neoplasm: Secondary | ICD-10-CM

## 2018-02-14 NOTE — Progress Notes (Signed)
Subjective:     Barry Robertson  Here for follow-up status post laparoscopic cholecystectomy.  Doing well.  States his GERD symptoms seem to be less. Objective:    BP 134/80 (BP Location: Left Arm, Patient Position: Sitting, Cuff Size: Large)   Pulse 87   Temp 97.7 F (36.5 C) (Temporal)   Resp 18   Wt 241 lb 9.6 oz (109.6 kg)   BMI 34.67 kg/m   General:  alert, cooperative and no distress  Abdomen soft, incisions healing well.  Staples removed, Steri-Strips applied. Final pathology consistent with diagnosis.     Assessment:    Doing well postoperatively.    Plan:   May return to work without restrictions on 02/20/2018.  Follow-up here as needed.

## 2018-03-13 ENCOUNTER — Other Ambulatory Visit (INDEPENDENT_AMBULATORY_CARE_PROVIDER_SITE_OTHER): Payer: Self-pay | Admitting: Internal Medicine

## 2018-03-13 DIAGNOSIS — K219 Gastro-esophageal reflux disease without esophagitis: Secondary | ICD-10-CM

## 2018-10-03 ENCOUNTER — Other Ambulatory Visit: Payer: Self-pay

## 2018-10-03 ENCOUNTER — Ambulatory Visit (INDEPENDENT_AMBULATORY_CARE_PROVIDER_SITE_OTHER): Payer: BLUE CROSS/BLUE SHIELD | Admitting: Internal Medicine

## 2018-10-03 ENCOUNTER — Encounter (INDEPENDENT_AMBULATORY_CARE_PROVIDER_SITE_OTHER): Payer: Self-pay | Admitting: Internal Medicine

## 2018-10-03 VITALS — BP 138/79 | HR 67 | Temp 98.3°F | Resp 18 | Ht 70.0 in | Wt 244.0 lb

## 2018-10-03 DIAGNOSIS — K219 Gastro-esophageal reflux disease without esophagitis: Secondary | ICD-10-CM | POA: Diagnosis not present

## 2018-10-03 DIAGNOSIS — K2270 Barrett's esophagus without dysplasia: Secondary | ICD-10-CM | POA: Diagnosis not present

## 2018-10-03 NOTE — Progress Notes (Signed)
Presenting complaint;  Follow for chronic GERD complicated by short segment Barrett's.     Database and subjective  Barry Robertson is 38 year old Caucasian male with several year history of GERD who underwent EGD in September 2018 revealing short segment Barrett's esophagus and 3 cm size sliding hiatal hernia.  Esophageal biopsy confirmed Barrett's esophagus without dysplasia.  He was last seen on 09/27/2017 advised to back off on PPI dose to once daily along with famotidine. He returns for yearly visit.  He states he is doing well.  He takes pantoprazole 40 mg daily before breakfast and famotidine at bedtime.  He only has heartburn if he eats certain foods such as ice cream or bread.  He rarely experiences nocturnal regurgitation.  He says supper at least 3 hours before he goes to bed.  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  He has gained 9 pounds since his last visit 1 year ago.  He is not having any side effects with medications.  He denies sore throat hoarseness chronic cough.  He also denies abdominal pain melena or rectal bleeding.   Current Medications: Outpatient Encounter Medications as of 10/03/2018  Medication Sig  . famotidine (PEPCID) 20 MG tablet Take 1 tablet (20 mg total) by mouth at bedtime.  . pantoprazole (PROTONIX) 40 MG tablet TAKE 1 TABLET BY MOUTH TWICE DAILY BEFOREA MEAL   No facility-administered encounter medications on file as of 10/03/2018.      Objective: Blood pressure 138/79, pulse 67, temperature 98.3 F (36.8 C), temperature source Oral, resp. rate 18, height 5\' 10"  (1.778 m), weight 244 lb (110.7 kg). Patient is alert and in no acute distress. Conjunctiva is pink. Sclera is nonicteric Oropharyngeal mucosa is normal. No neck masses or thyromegaly noted. Cardiac exam with regular rhythm normal S1 and S2. No murmur or gallop noted. Lungs are  clear to auscultation. Abdomen abdomen is full but soft and nontender with organomegaly or masses. No LE edema or clubbing noted. He has vitiligo involving both hands.   Assessment:  #1.  Chronic GERD complicated by short segment Barrett's esophagus.  Symptom control is satisfactory with PPI in the morning and H2B in the evening and antireflux measures.  He does not smoke cigarettes or drinks excessive amount of alcohol.  Therefore his risk for progressive disease i.e. dysplasia is very low. He could deftly wait another 3 years before next EGD.  Plan:  Patient will continue antireflux measures pantoprazole 40 mg daily and famotidine 20 mg daily and return for office visit in 1 year. Patient will call if he develops dysphagia or symptoms and not controlled anymore with current therapy.

## 2018-10-03 NOTE — Patient Instructions (Signed)
Notify if you have swallowing difficulty or heartburn despite taking medications.

## 2018-11-15 ENCOUNTER — Telehealth (INDEPENDENT_AMBULATORY_CARE_PROVIDER_SITE_OTHER): Payer: Self-pay | Admitting: Internal Medicine

## 2018-11-15 NOTE — Telephone Encounter (Signed)
Patient called stated pharmacy had sent a refill for his pantoprazole and has not received it back -  Patients ph# [phone]

## 2018-11-23 NOTE — Telephone Encounter (Signed)
Patient was called and ask to call his pharmacy. He should have a refill.

## 2019-02-07 ENCOUNTER — Other Ambulatory Visit (INDEPENDENT_AMBULATORY_CARE_PROVIDER_SITE_OTHER): Payer: Self-pay | Admitting: Gastroenterology

## 2019-02-07 DIAGNOSIS — K219 Gastro-esophageal reflux disease without esophagitis: Secondary | ICD-10-CM

## 2019-02-07 MED ORDER — PANTOPRAZOLE SODIUM 40 MG PO TBEC: 40.0000 mg | DELAYED_RELEASE_TABLET | Freq: Every day | ORAL | 3 refills | Status: DC

## 2019-03-09 MED FILL — PANTOPRAZOLE SOD DR 40 MG T: 40 | 90 days supply | Qty: 90 | Fill #0

## 2019-05-30 MED FILL — PANTOPRAZOLE SOD DR 40 MG T: 40 | 90 days supply | Qty: 90 | Fill #1

## 2019-08-16 MED FILL — PANTOPRAZOLE SOD DR 40 MG T: 40 | 90 days supply | Qty: 90 | Fill #2

## 2019-09-20 IMAGING — CT CT ABD-PELV W/ CM
2 of 4 series · 16 of 46 positions shown, 18 images · IV contrast (iopamidol)
Comparison: None.

CLINICAL DATA: Known gallstones. Tonight acute epigastric pain and
nausea.

EXAM:
CT ABDOMEN AND PELVIS WITH CONTRAST
TECHNIQUE: Multidetector CT imaging of the abdomen and pelvis was performed
using the standard protocol following bolus administration of
intravenous contrast.
CONTRAST:  100mL VTIJTW-9MM IOPAMIDOL (VTIJTW-9MM) INJECTION 61%

[Series 2: axial st · axial · 0.83mm/px · z∈[+895,+1360]mm · 13 of 103 slices shown, 15 images]
[im 5/103  soft-tissue]
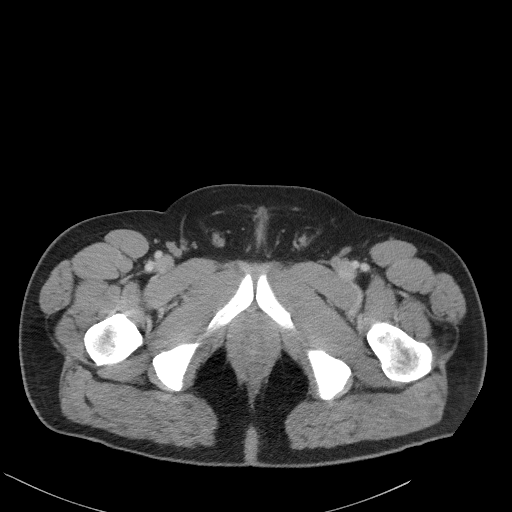
[im 5/103  bone]
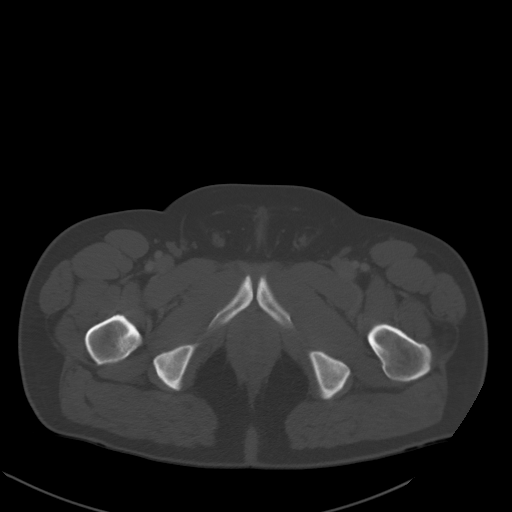
[im 14/103  soft-tissue]
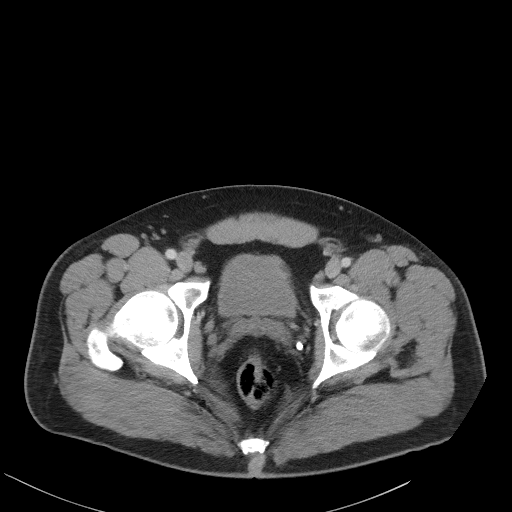
[im 23/103  soft-tissue]
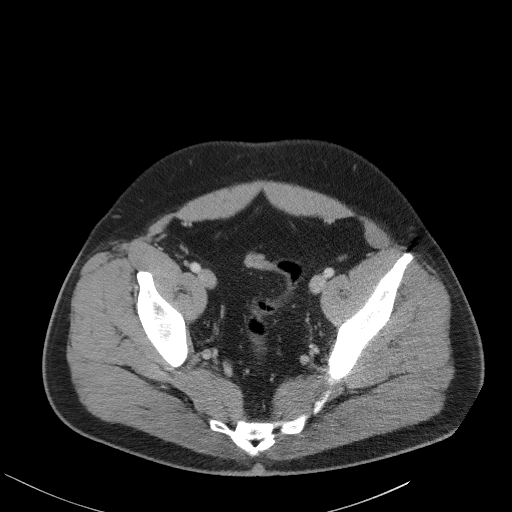
[im 27/103  soft-tissue]
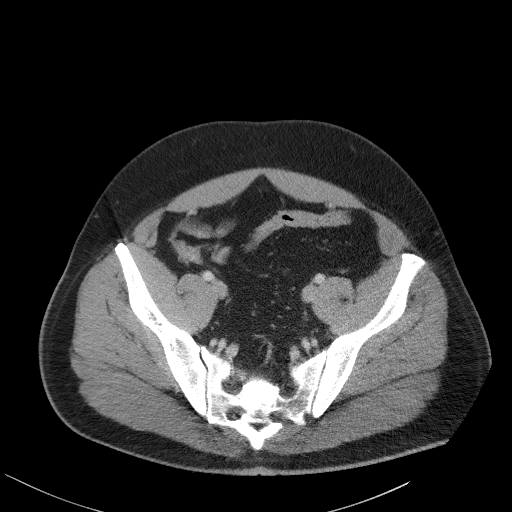
[im 36/103  soft-tissue]
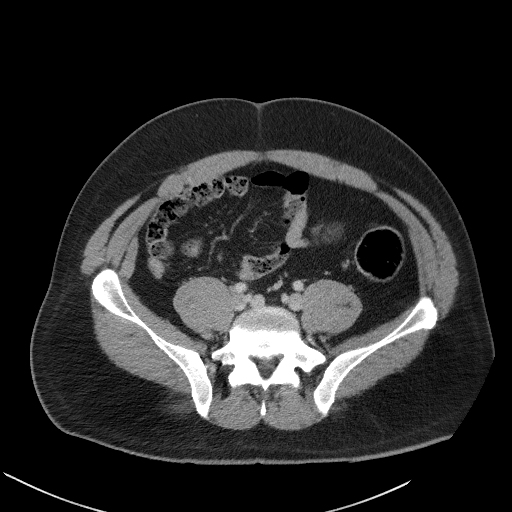
[im 45/103  soft-tissue]
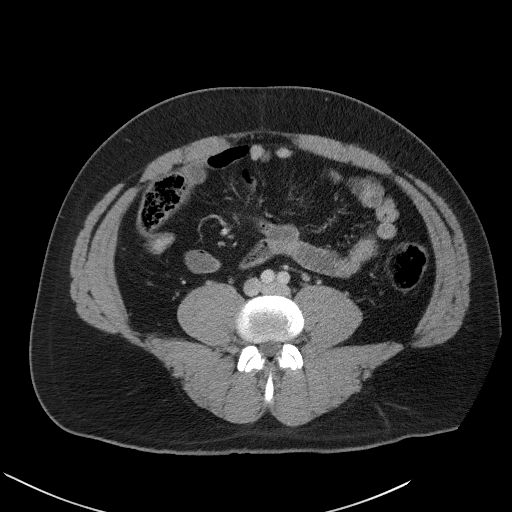
[im 54/103  soft-tissue]
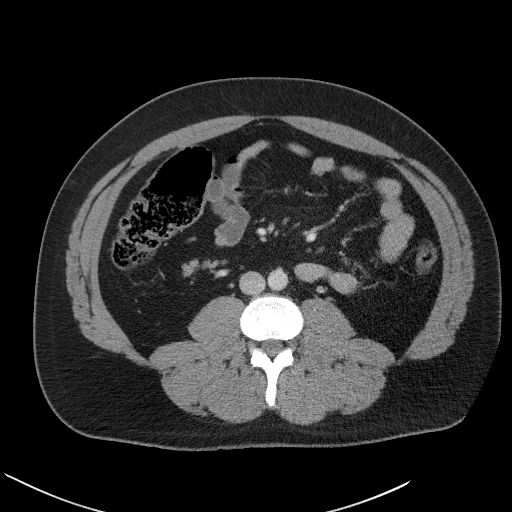
[im 58/103  soft-tissue]
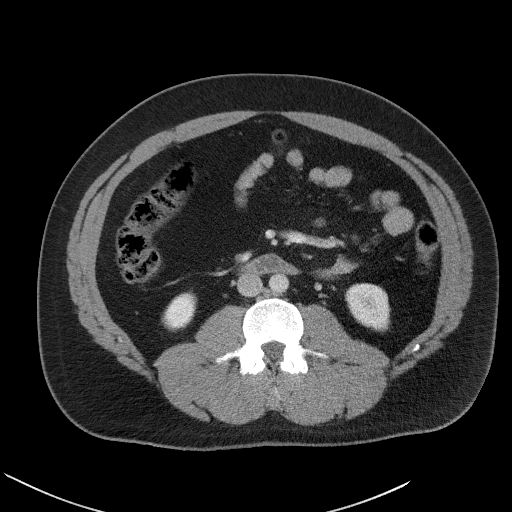
[im 67/103  soft-tissue]
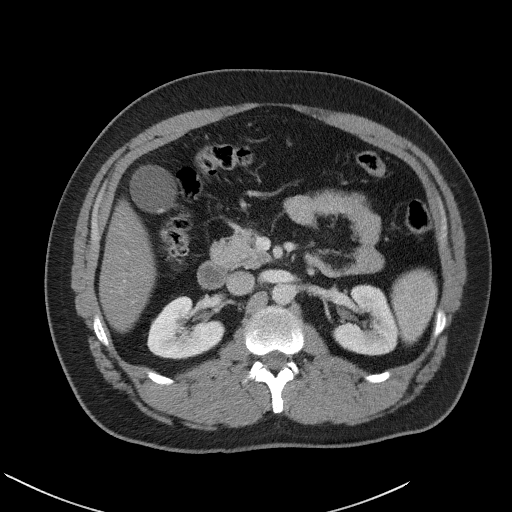
[im 67/103  bone]
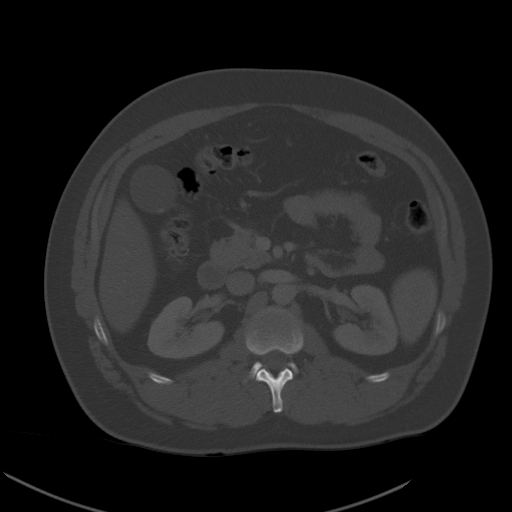
[im 76/103  soft-tissue]
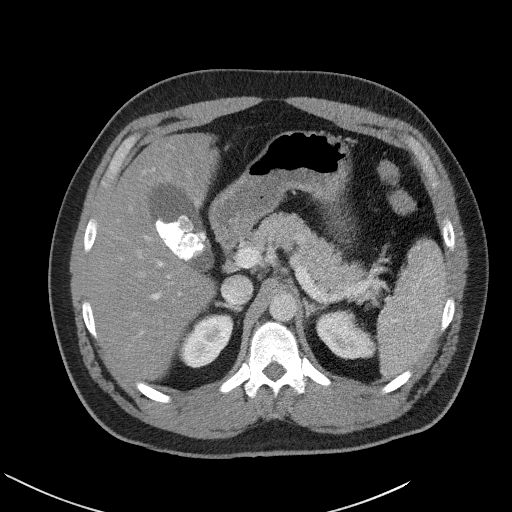
[im 80/103  soft-tissue]
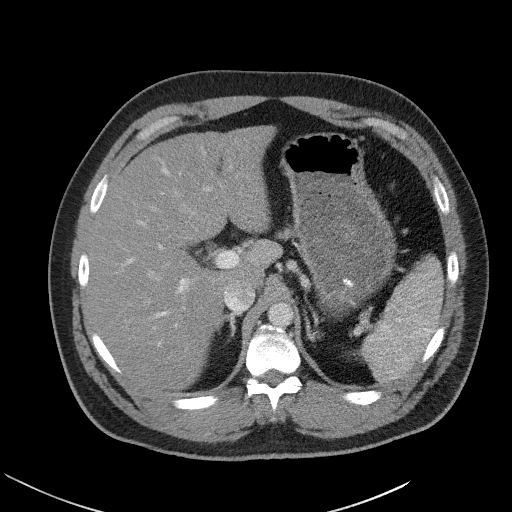
[im 89/103  soft-tissue]
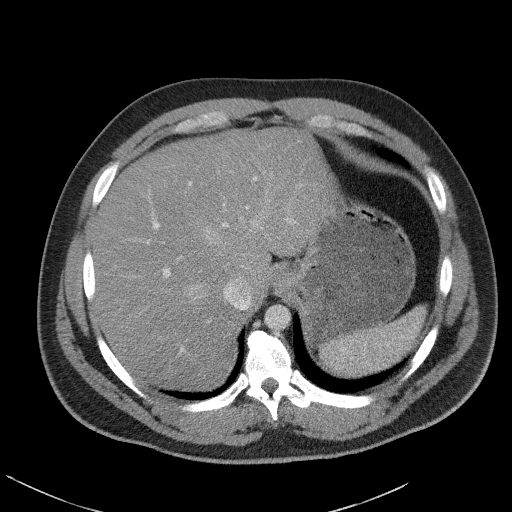
[im 98/103  soft-tissue]
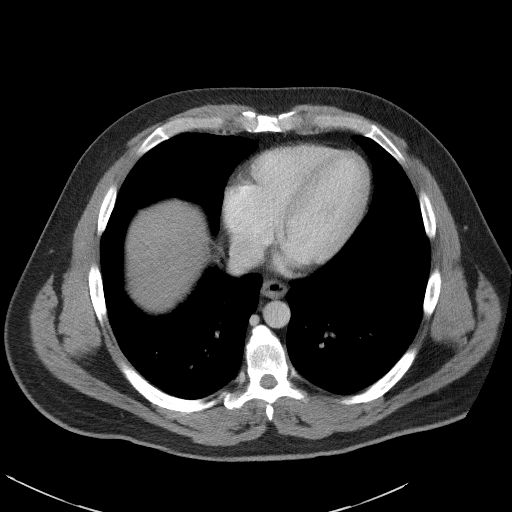

[Series 5: coronal st · coronal · 0.89mm/px · 3 of 99 slices shown]
[im 33/99  soft-tissue]
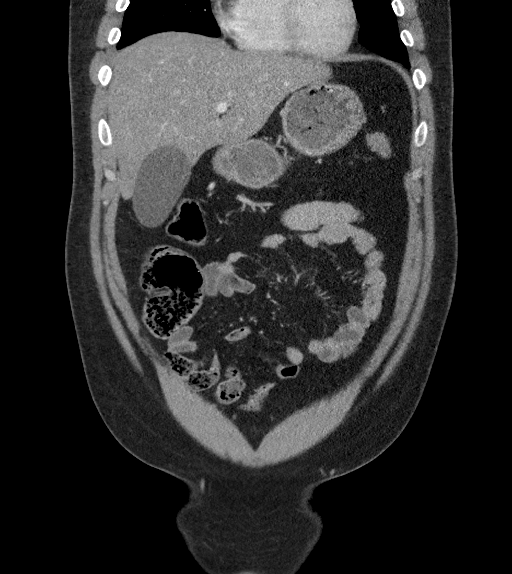
[im 44/99  soft-tissue]
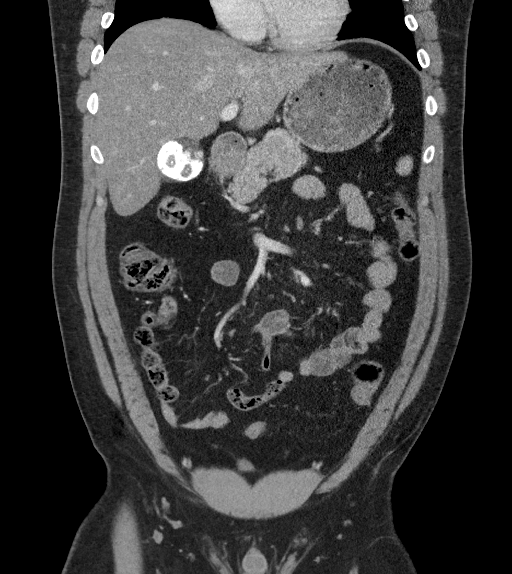
[im 55/99  soft-tissue]
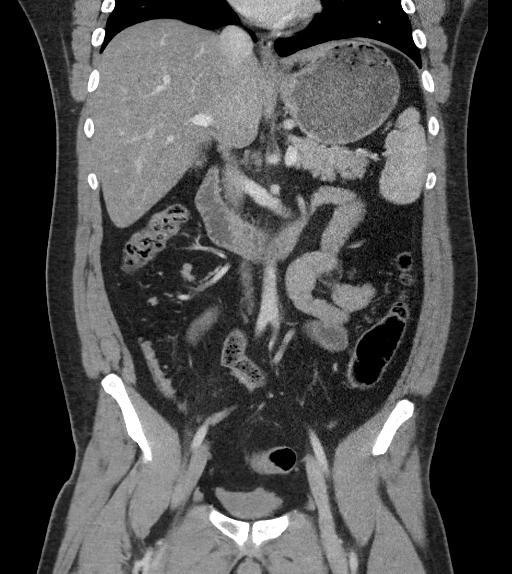

[16 of 46 positions shown; findings below may reference images not displayed]

FINDINGS: Lower chest: Lung bases are clear.

Hepatobiliary: Diffuse fatty infiltration of the liver. No focal
lesions. Multiple gallstones. No gallbladder wall thickening or
edema. No bile duct dilatation.

Pancreas: Unremarkable. No pancreatic ductal dilatation or
surrounding inflammatory changes.

Spleen: Normal in size without focal abnormality.

Adrenals/Urinary Tract: No adrenal gland nodules. Nephrograms are
symmetrical. No hydronephrosis or hydroureter. There is a 6 mm
diameter enhancing exophytic lesion arising off of the midpole right
kidney. This could represent an adenoma. This is too small to
characterize by size and follow-up in 1 year is suggested. Bladder
is unremarkable.

Stomach/Bowel: Stomach is within normal limits. Appendix appears
normal. No evidence of bowel wall thickening, distention, or
inflammatory changes.

Vascular/Lymphatic: No significant vascular findings are present. No
enlarged abdominal or pelvic lymph nodes.

Reproductive: Prostate is unremarkable.

Other: No abdominal wall hernia or abnormality. No abdominopelvic
ascites.

Musculoskeletal: No acute or significant osseous findings.
IMPRESSION: 1. Diffuse fatty infiltration of the liver.
2. Cholelithiasis without changes of cholecystitis.
3. 6 mm indeterminate enhancing nodule on the right kidney.
Follow-up in 1 year suggested.

## 2019-10-01 ENCOUNTER — Other Ambulatory Visit: Payer: Self-pay

## 2019-10-01 ENCOUNTER — Ambulatory Visit (HOSPITAL_COMMUNITY)
Admission: RE | Admit: 2019-10-01 | Discharge: 2019-10-01 | Disposition: A | Discharge status: Home / Self Care | Payer: Managed Care, Other (non HMO) | Source: Ambulatory Visit | Attending: Adult Health Nurse Practitioner | Admitting: Adult Health Nurse Practitioner

## 2019-10-01 ENCOUNTER — Other Ambulatory Visit (HOSPITAL_COMMUNITY): Payer: Self-pay | Admitting: Adult Health Nurse Practitioner

## 2019-10-01 ENCOUNTER — Other Ambulatory Visit: Payer: Self-pay | Admitting: Physician Assistant

## 2019-10-01 DIAGNOSIS — U071 COVID-19: Secondary | ICD-10-CM

## 2019-10-01 NOTE — Progress Notes (Signed)
I connected by phone with Barry Robertson on 10/01/2019 at 4:20 PM to discuss the potential use of a new treatment for mild to moderate COVID-19 viral infection in non-hospitalized patients.  This patient is a 39 y.o. male that meets the FDA criteria for Emergency Use Authorization of COVID monoclonal antibody casirivimab/imdevimab.  Has a (+) direct SARS-CoV-2 viral test result  Has mild or moderate COVID-19   Is NOT hospitalized due to COVID-19  Is within 10 days of symptom onset  Has at least one of the high risk factor(s) for progression to severe COVID-19 and/or hospitalization as defined in EUA.  Specific high risk criteria : BMI > 25   I have spoken and communicated the following to the patient or parent/caregiver regarding COVID monoclonal antibody treatment:  1. FDA has authorized the emergency use for the treatment of mild to moderate COVID-19 in adults and pediatric patients with positive results of direct SARS-CoV-2 viral testing who are 1 years of age and older weighing at least 40 kg, and who are at high risk for progressing to severe COVID-19 and/or hospitalization.  2. The significant known and potential risks and benefits of COVID monoclonal antibody, and the extent to which such potential risks and benefits are unknown.  3. Information on available alternative treatments and the risks and benefits of those alternatives, including clinical trials.  4. Patients treated with COVID monoclonal antibody should continue to self-isolate and use infection control measures (e.g., wear mask, isolate, social distance, avoid sharing personal items, clean and disinfect "high touch" surfaces, and frequent handwashing) according to CDC guidelines.   5. The patient or parent/caregiver has the option to accept or refuse COVID monoclonal antibody treatment.  After reviewing this information with the patient, The patient agreed to proceed with receiving casirivimab\imdevimab infusion and  will be provided a copy of the Fact sheet prior to receiving the infusion. [name] 10/01/2019 4:20 PM

## 2019-10-02 ENCOUNTER — Ambulatory Visit (INDEPENDENT_AMBULATORY_CARE_PROVIDER_SITE_OTHER): Payer: BC Managed Care – PPO | Admitting: Internal Medicine

## 2019-10-02 ENCOUNTER — Ambulatory Visit (HOSPITAL_COMMUNITY)
Admission: RE | Admit: 2019-10-02 | Discharge: 2019-10-02 | Disposition: A | Discharge status: Home / Self Care | Payer: Managed Care, Other (non HMO) | Source: Ambulatory Visit | Attending: Pulmonary Disease | Admitting: Pulmonary Disease

## 2019-10-02 DIAGNOSIS — Z6825 Body mass index (BMI) 25.0-25.9, adult: Secondary | ICD-10-CM | POA: Insufficient documentation

## 2019-10-02 DIAGNOSIS — E663 Overweight: Secondary | ICD-10-CM | POA: Insufficient documentation

## 2019-10-02 DIAGNOSIS — U071 COVID-19: Secondary | ICD-10-CM | POA: Diagnosis not present

## 2019-10-02 MED ORDER — SODIUM CHLORIDE 0.9 % IV SOLN: INTRAVENOUS | Status: DC | PRN

## 2019-10-02 MED ORDER — SODIUM CHLORIDE 0.9 % IV SOLN
1200.0000 mg | Freq: Once | INTRAVENOUS | Status: AC
  Administered 2019-10-02: 1200 mg via INTRAVENOUS
  Filled 2019-10-02: qty 10

## 2019-10-02 MED ORDER — ALBUTEROL SULFATE HFA 108 (90 BASE) MCG/ACT IN AERS: 2.0000 | INHALATION_SPRAY | Freq: Once | RESPIRATORY_TRACT | Status: DC | PRN

## 2019-10-02 MED ORDER — EPINEPHRINE 0.3 MG/0.3ML IJ SOAJ: 0.3000 mg | Freq: Once | INTRAMUSCULAR | Status: DC | PRN

## 2019-10-02 MED ORDER — DIPHENHYDRAMINE HCL 50 MG/ML IJ SOLN: 50.0000 mg | Freq: Once | INTRAMUSCULAR | Status: DC | PRN

## 2019-10-02 MED ORDER — FAMOTIDINE IN NACL 20-0.9 MG/50ML-% IV SOLN: 20.0000 mg | Freq: Once | INTRAVENOUS | Status: DC | PRN

## 2019-10-02 MED ORDER — METHYLPREDNISOLONE SODIUM SUCC 125 MG IJ SOLR: 125.0000 mg | Freq: Once | INTRAMUSCULAR | Status: DC | PRN

## 2019-10-02 NOTE — Progress Notes (Signed)
  Diagnosis: COVID-19  Physician:  Procedure: [facility] Med: casirivimab\imdevimab infusion - Provided patient with casirivimab\imdevimab fact sheet for patients, parents and caregivers prior to infusion.  Complications: No immediate complications noted.  Discharge: Discharged home   Barry Robertson 10/02/2019

## 2019-10-02 NOTE — Discharge Instructions (Signed)

## 2020-02-08 ENCOUNTER — Other Ambulatory Visit (INDEPENDENT_AMBULATORY_CARE_PROVIDER_SITE_OTHER): Payer: Self-pay | Admitting: Internal Medicine

## 2020-02-08 DIAGNOSIS — K219 Gastro-esophageal reflux disease without esophagitis: Secondary | ICD-10-CM

## 2020-02-11 NOTE — Telephone Encounter (Signed)
Patient is aware he is in need of an appointment for further refills. Mitzie please call the patient and schedule an appt for him Please. Thank You.

## 2020-02-11 NOTE — Telephone Encounter (Signed)
Will refill for one month but will need follow up appointment for further refills

## 2020-02-11 NOTE — Telephone Encounter (Signed)
Patient last seen by Dr. Karilyn Cota 10/03/2018 with Genella Rife.

## 2020-05-12 ENCOUNTER — Other Ambulatory Visit (INDEPENDENT_AMBULATORY_CARE_PROVIDER_SITE_OTHER): Payer: Self-pay

## 2020-05-12 ENCOUNTER — Encounter (INDEPENDENT_AMBULATORY_CARE_PROVIDER_SITE_OTHER): Payer: Self-pay

## 2020-05-12 ENCOUNTER — Ambulatory Visit (INDEPENDENT_AMBULATORY_CARE_PROVIDER_SITE_OTHER): Payer: Managed Care, Other (non HMO) | Admitting: Gastroenterology

## 2020-05-12 ENCOUNTER — Encounter (INDEPENDENT_AMBULATORY_CARE_PROVIDER_SITE_OTHER): Payer: Self-pay | Admitting: Gastroenterology

## 2020-05-12 ENCOUNTER — Other Ambulatory Visit: Payer: Self-pay

## 2020-05-12 ENCOUNTER — Other Ambulatory Visit (INDEPENDENT_AMBULATORY_CARE_PROVIDER_SITE_OTHER): Payer: Self-pay | Admitting: Gastroenterology

## 2020-05-12 VITALS — BP 145/91 | HR 87 | Temp 98.1°F | Ht 70.0 in | Wt 256.0 lb

## 2020-05-12 DIAGNOSIS — K219 Gastro-esophageal reflux disease without esophagitis: Secondary | ICD-10-CM

## 2020-05-12 DIAGNOSIS — K2270 Barrett's esophagus without dysplasia: Secondary | ICD-10-CM | POA: Diagnosis not present

## 2020-05-12 DIAGNOSIS — K2100 Gastro-esophageal reflux disease with esophagitis, without bleeding: Secondary | ICD-10-CM | POA: Diagnosis not present

## 2020-05-12 MED ORDER — PANTOPRAZOLE SODIUM 40 MG PO TBEC: DELAYED_RELEASE_TABLET | ORAL | 3 refills | Status: DC

## 2020-05-12 MED ORDER — FAMOTIDINE 40 MG PO TABS: 40.0000 mg | ORAL_TABLET | Freq: Every day | ORAL | 3 refills | Status: AC

## 2020-05-12 NOTE — Patient Instructions (Addendum)
Continue pantoprazole 40 mg qday and Famotidine 40 mg qday Schedule EGD Continue exercising and decrease portion size to attempt further body fat loss

## 2020-05-12 NOTE — H&P (View-Only) (Signed)
Siris Hoos Castaneda, M.D. Gastroenterology & Hepatology [facility] [address] [place], [place] [address]  Primary Care Physician: Hall, John Z, MD [address] [place] [address]  I will communicate my assessment and recommendations to the referring MD via EMR.  Problems: 1. GERD 2. Short segment Barrett's esophagus  History of Present Illness: Barry Robertson is a 40 y.o. male with past medical history of GERD, gout and Barrett's esophagus without dysplasia, who presents for follow up of GERD.  The patient was last seen on 10/03/2018. At that time, the patient was advised to take pantoprazole 40 mg every day and famotidine 20 mg daily.  Patient reports that he is currently on pantoprazole 40 mg every night, and he has increased to famotidine 20 mg 2 pills every evening. He reports that he tried taking pantoprazole in the morning but it did not help as good as trying it during the evening. He reports that possibly 3-4 times a month he has episodes of heartburn and regurgitation during the night, for which he takes Tums that improve his symptoms.  He reports that he tried Prilosec, Nexium and Zegerid in the past but he did not feel that it really helped as good as pantoprazole.  The patient denies having any nausea, vomiting, fever, chills, hematochezia, melena, hematemesis, abdominal distention, dysphagia, odynophagia, abdominal pain, diarrhea, jaundice, pruritus. States that his weight increased after doing more workout in the gym so he believes that he has lost some fat and gained muscle.  Last EGD: 2018 - LA Grade A reflux esophagitis. - Z-line irregular, 34 cm from the incisors. Biopsied - consistent with BE. - 4 cm hiatal hernia. - A single gastric polyp. - Erythematous mucosa in the antrum. - Normal duodenal bulb and second portion of the duodenum.   Past Medical History: Past Medical History:  Diagnosis Date  .  Gallstones   . Gout     Past Surgical History: Past Surgical History:  Procedure Laterality Date  . BIOPSY  11/05/2016   Procedure: BIOPSY;  Surgeon: Rehman, Najeeb U, MD;  [facility];  Service: Endoscopy;;  esophagus antrum  . CHOLECYSTECTOMY N/A 02/03/2018   Procedure: LAPAROSCOPIC CHOLECYSTECTOMY;  Surgeon: Jenkins, Mark, MD;  Location: [facility];  Service: General;  Laterality: N/A;  . ESOPHAGOGASTRODUODENOSCOPY N/A 11/05/2016   Procedure: ESOPHAGOGASTRODUODENOSCOPY (EGD);  Surgeon: Rehman, Najeeb U, MD;  Location: [facility];  Service: Endoscopy;  Laterality: N/A;  10:50  . WRIST SURGERY Left     Family History:History reviewed. No pertinent family history.  Social History: Social History   Tobacco Use  Smoking Status Never Smoker  Smokeless Tobacco Never Used   Social History   Substance and Sexual Activity  Alcohol Use Yes   Comment: rarely   Social History   Substance and Sexual Activity  Drug Use No    Allergies: No Known Allergies  Medications: Current Outpatient Medications  Medication Sig Dispense Refill  . famotidine (PEPCID) 20 MG tablet Take 1 tablet (20 mg total) by mouth at bedtime. (Patient taking differently: Take 40 mg by mouth at bedtime.)    . pantoprazole (PROTONIX) 40 MG tablet TAKE 1 TABLET(40 MG) BY MOUTH DAILY 30 tablet 5   No current facility-administered medications for this visit.    Review of Systems: GENERAL: negative for malaise, night sweats HEENT: No changes in hearing or vision, no nose bleeds or other nasal problems. NECK: Negative for lumps, goiter, pain and significant   neck swelling RESPIRATORY: Negative for cough, wheezing CARDIOVASCULAR: Negative for chest pain, leg swelling, palpitations, orthopnea GI: SEE HPI MUSCULOSKELETAL: Negative for joint pain or swelling, back pain, and muscle pain. SKIN: Negative for lesions, rash PSYCH: Negative for sleep disturbance, mood disorder and recent psychosocial  stressors. HEMATOLOGY Negative for prolonged bleeding, bruising easily, and swollen nodes. ENDOCRINE: Negative for cold or heat intolerance, polyuria, polydipsia and goiter. NEURO: negative for tremor, gait imbalance, syncope and seizures. The remainder of the review of systems is noncontributory.   Physical Exam: BP (!) 145/91 (BP Location: Left Arm, Patient Position: Sitting, Cuff Size: Large)   Pulse 87   Temp 98.1 F (36.7 C) (Oral)   Ht 5\' 10"  (1.778 m)   Wt 256 lb (116.1 kg)   BMI 36.73 kg/m  GENERAL: The patient is AO x3, in no acute distress. Obese. HEENT: Head is normocephalic and atraumatic. EOMI are intact. Mouth is well hydrated and without lesions. NECK: Supple. No masses LUNGS: Clear to auscultation. No presence of rhonchi/wheezing/rales. Adequate chest expansion HEART: RRR, normal s1 and s2. ABDOMEN: Soft, nontender, no guarding, no peritoneal signs, and nondistended. BS +. No masses. EXTREMITIES: Without any cyanosis, clubbing, rash, lesions or edema. NEUROLOGIC: AOx3, no focal motor deficit. SKIN: no jaundice, no rashes  Imaging/Labs: as above  I personally reviewed and interpreted the available labs, imaging and endoscopic files.  Impression and Plan: Barry Robertson is a 40 y.o. male with past medical history of GERD, gout and Barrett's esophagus without dysplasia, who presents for follow up of GERD.  The patient has presented relatively good control of his GERD with the current regimen which includes 1 PPI and an anti-H2 medication.  It would be important to assess if this has led to endoscopic healing of his esophagitis but also he is due for surveillance for his Barrett's esophagus, the patient understood and agreed to proceed with a repeat EGD.  I explained to him that if there was evidence of active esophagitis, will need to optimize his regimen with a PPI twice a day.  Ultimately, if this fails to provide any relief of his GERD, he will need to consider  surgical correction of his hiatal hernia and Nissen fundoplication.  - Continue pantoprazole 40 mg qday and Famotidine 40 mg qday - Schedule EGD - Continue exercising and decrease portion size to attempt further body fat loss  All questions were answered.      41, MD Gastroenterology and Hepatology [facility]

## 2020-05-12 NOTE — Progress Notes (Signed)
Barry Robertson, M.D. Gastroenterology & Hepatology [facility] [address] [place], [place] [address]  Primary Care Physician: Barry Stabile, MD [address] [place] [address]  I will communicate my assessment and recommendations to the referring MD via EMR.  Problems: 1. GERD 2. Short segment Barrett's esophagus  History of Present Illness: Barry Robertson is a 40 y.o. male with past medical history of GERD, gout and Barrett's esophagus without dysplasia, who presents for follow up of GERD.  The patient was last seen on 10/03/2018. At that time, the patient was advised to take pantoprazole 40 mg every day and famotidine 20 mg daily.  Patient reports that he is currently on pantoprazole 40 mg every night, and he has increased to famotidine 20 mg 2 pills every evening. He reports that he tried taking pantoprazole in the morning but it did not help as good as trying it during the evening. He reports that possibly 3-4 times a month he has episodes of heartburn and regurgitation during the night, for which he takes Tums that improve his symptoms.  He reports that he tried Prilosec, Nexium and Zegerid in the past but he did not feel that it really helped as good as pantoprazole.  The patient denies having any nausea, vomiting, fever, chills, hematochezia, melena, hematemesis, abdominal distention, dysphagia, odynophagia, abdominal pain, diarrhea, jaundice, pruritus. States that his weight increased after doing more workout in the gym so he believes that he has lost some fat and gained muscle.  Last EGD: 2018 - LA Grade A reflux esophagitis. - Z-line irregular, 34 cm from the incisors. Biopsied - consistent with BE. - 4 cm hiatal hernia. - A single gastric polyp. - Erythematous mucosa in the antrum. - Normal duodenal bulb and second portion of the duodenum.   Past Medical History: Past Medical History:  Diagnosis Date  .  Gallstones   . Gout     Past Surgical History: Past Surgical History:  Procedure Laterality Date  . BIOPSY  11/05/2016   Procedure: BIOPSY;  Surgeon: Malissa Hippo, MD;  [facility];  Service: Endoscopy;;  esophagus antrum  . CHOLECYSTECTOMY N/A 02/03/2018   Procedure: LAPAROSCOPIC CHOLECYSTECTOMY;  Surgeon: Franky Macho, MD;  Location: [facility];  Service: General;  Laterality: N/A;  . ESOPHAGOGASTRODUODENOSCOPY N/A 11/05/2016   Procedure: ESOPHAGOGASTRODUODENOSCOPY (EGD);  Surgeon: Malissa Hippo, MD;  [facility];  Service: Endoscopy;  Laterality: N/A;  10:50  . WRIST SURGERY Left     Family History:History reviewed. No pertinent family history.  Social History: Social History   Tobacco Use  Smoking Status Never Smoker  Smokeless Tobacco Never Used   Social History   Substance and Sexual Activity  Alcohol Use Yes   Comment: rarely   Social History   Substance and Sexual Activity  Drug Use No    Allergies: No Known Allergies  Medications: Current Outpatient Medications  Medication Sig Dispense Refill  . famotidine (PEPCID) 20 MG tablet Take 1 tablet (20 mg total) by mouth at bedtime. (Patient taking differently: Take 40 mg by mouth at bedtime.)    . pantoprazole (PROTONIX) 40 MG tablet TAKE 1 TABLET(40 MG) BY MOUTH DAILY 30 tablet 5   No current facility-administered medications for this visit.    Review of Systems: GENERAL: negative for malaise, night sweats HEENT: No changes in hearing or vision, no nose bleeds or other nasal problems. NECK: Negative for lumps, goiter, pain and significant  neck swelling RESPIRATORY: Negative for cough, wheezing CARDIOVASCULAR: Negative for chest pain, leg swelling, palpitations, orthopnea GI: SEE HPI MUSCULOSKELETAL: Negative for joint pain or swelling, back pain, and muscle pain. SKIN: Negative for lesions, rash PSYCH: Negative for sleep disturbance, mood disorder and recent psychosocial  stressors. HEMATOLOGY Negative for prolonged bleeding, bruising easily, and swollen nodes. ENDOCRINE: Negative for cold or heat intolerance, polyuria, polydipsia and goiter. NEURO: negative for tremor, gait imbalance, syncope and seizures. The remainder of the review of systems is noncontributory.   Physical Exam: BP (!) 145/91 (BP Location: Left Arm, Patient Position: Sitting, Cuff Size: Large)   Pulse 87   Temp 98.1 F (36.7 C) (Oral)   Ht 5\' 10"  (1.778 m)   Wt 256 lb (116.1 kg)   BMI 36.73 kg/m  GENERAL: The patient is AO x3, in no acute distress. Obese. HEENT: Head is normocephalic and atraumatic. EOMI are intact. Mouth is well hydrated and without lesions. NECK: Supple. No masses LUNGS: Clear to auscultation. No presence of rhonchi/wheezing/rales. Adequate chest expansion HEART: RRR, normal s1 and s2. ABDOMEN: Soft, nontender, no guarding, no peritoneal signs, and nondistended. BS +. No masses. EXTREMITIES: Without any cyanosis, clubbing, rash, lesions or edema. NEUROLOGIC: AOx3, no focal motor deficit. SKIN: no jaundice, no rashes  Imaging/Labs: as above  I personally reviewed and interpreted the available labs, imaging and endoscopic files.  Impression and Plan: Barry Robertson is a 40 y.o. male with past medical history of GERD, gout and Barrett's esophagus without dysplasia, who presents for follow up of GERD.  The patient has presented relatively good control of his GERD with the current regimen which includes 1 PPI and an anti-H2 medication.  It would be important to assess if this has led to endoscopic healing of his esophagitis but also he is due for surveillance for his Barrett's esophagus, the patient understood and agreed to proceed with a repeat EGD.  I explained to him that if there was evidence of active esophagitis, will need to optimize his regimen with a PPI twice a day.  Ultimately, if this fails to provide any relief of his GERD, he will need to consider  surgical correction of his hiatal hernia and Nissen fundoplication.  - Continue pantoprazole 40 mg qday and Famotidine 40 mg qday - Schedule EGD - Continue exercising and decrease portion size to attempt further body fat loss  All questions were answered.      41, MD Gastroenterology and Hepatology [facility]

## 2020-06-03 NOTE — Patient Instructions (Signed)
20    Your procedure is scheduled on: 06/06/2020  Report to [facility] Main Entrance at  9:45   AM.  Call this number if you have problems the morning of surgery: [phone]   Remember:   Follow instructions on letter from office regarding when to stop eating and drinking        No Smoking the day of procedure      Take these medicines the morning of surgery with A SIP OF WATER: Pepcid   Do not wear jewelry, make-up or nail polish.  Do not wear lotions, powders, or perfumes. You may wear deodorant.                Do not bring valuables to the hospital.  Contacts, dentures or bridgework may not be worn into surgery.  Leave suitcase in the car. After surgery it may be brought to your room.  For patients admitted to the hospital, checkout time is 11:00 AM the day of discharge.   Patients discharged the day of surgery will not be allowed to drive home. Upper Endoscopy, Adult Upper endoscopy is a procedure to look inside the upper GI (gastrointestinal) tract. The upper GI tract is made up of:  The part of the body that moves food from your mouth to your stomach (esophagus).  The stomach.  The first part of your small intestine (duodenum). This procedure is also called esophagogastroduodenoscopy (EGD) or gastroscopy. In this procedure, your health care provider passes a thin, flexible tube (endoscope) through your mouth and down your esophagus into your stomach. A small camera is attached to the end of the tube. Images from the camera appear on a monitor in the exam room. During this procedure, your health care provider may also remove a small piece of tissue to be sent to a lab and examined under a microscope (biopsy). Your health care provider may do an upper endoscopy to diagnose cancers of the upper GI tract. You may also have this procedure to find the cause of other conditions, such as:  Stomach pain.  Heartburn.  Pain or problems when swallowing.  Nausea and vomiting.  Stomach  bleeding.  Stomach ulcers. Tell a health care provider about:  Any allergies you have.  All medicines you are taking, including vitamins, herbs, eye drops, creams, and over-the-counter medicines.  Any problems you or family members have had with anesthetic medicines.  Any blood disorders you have.  Any surgeries you have had.  Any medical conditions you have.  Whether you are pregnant or may be pregnant. What are the risks? Generally, this is a safe procedure. However, problems may occur, including:  Infection.  Bleeding.  Allergic reactions to medicines.  A tear or hole (perforation) in the esophagus, stomach, or duodenum. What happens before the procedure? Staying hydrated Follow instructions from your health care provider about hydration, which may include:  Up to 2 hours before the procedure - you may continue to drink clear liquids, such as water, clear fruit juice, black coffee, and plain tea.  Eating and drinking restrictions Follow instructions from your health care provider about eating and drinking, which may include:  8 hours before the procedure - stop eating heavy meals or foods, such as meat, fried foods, or fatty foods.  6 hours before the procedure - stop eating light meals or foods, such as toast or cereal.  6 hours before the procedure - stop drinking milk or drinks that contain milk.  2 hours before the procedure -  stop drinking clear liquids. Medicines Ask your health care provider about:  Changing or stopping your regular medicines. This is especially important if you are taking diabetes medicines or blood thinners.  Taking medicines such as aspirin and ibuprofen. These medicines can thin your blood. Do not take these medicines unless your health care provider tells you to take them.  Taking over-the-counter medicines, vitamins, herbs, and supplements. General instructions  Plan to have someone take you home from the hospital or clinic.  If  you will be going home right after the procedure, plan to have someone with you for 24 hours.  Ask your health care provider what steps will be taken to help prevent infection. What happens during the procedure?  1. An IV will be inserted into one of your veins. 2. You may be given one or more of the following: ? A medicine to help you relax (sedative). ? A medicine to numb the throat (local anesthetic). 3. You will lie on your left side on an exam table. 4. Your health care provider will pass the endoscope through your mouth and down your esophagus. 5. Your health care provider will use the scope to check the inside of your esophagus, stomach, and duodenum. Biopsies may be taken. 6. The endoscope will be removed. The procedure may vary among health care providers and hospitals. What happens after the procedure?  Your blood pressure, heart rate, breathing rate, and blood oxygen level will be monitored until you leave the hospital or clinic.  Do not drive for 24 hours if you were given a sedative during your procedure.  When your throat is no longer numb, you may be given some fluids to drink.  It is up to you to get the results of your procedure. Ask your health care provider, or the department that is doing the procedure, when your results will be ready. Summary  Upper endoscopy is a procedure to look inside the upper GI tract.  During the procedure, an IV will be inserted into one of your veins. You may be given a medicine to help you relax.  A medicine will be used to numb your throat.  The endoscope will be passed through your mouth and down your esophagus. This information is not intended to replace advice given to you by your health care provider. Make sure you discuss any questions you have with your health care provider. Document Revised: 07/20/2017 Document Reviewed: 06/27/2017 Elsevier Patient Education  [place] After  Please read the instructions outlined below and refer to this sheet in the next few weeks. These discharge instructions provide you with general information on caring for yourself after you leave the hospital. Your doctor may also give  you specific instructions. While your treatment has been planned according to the most current medical practices available, unavoidable complications occasionally occur. If you have any problems or questions after discharge, please call your doctor. HOME CARE INSTRUCTIONS Activity  You may resume your regular activity but move at a slower pace for the next 24 hours.   Take frequent rest periods for the next 24 hours.   Walking will help expel (get rid of) the air and reduce the bloated feeling in your abdomen.   No driving for 24 hours (because of the anesthesia (medicine) used during the test).   You may shower.   Do not sign any important legal documents or operate any machinery for 24 hours (because of the anesthesia used during the test).  Nutrition  Drink plenty of fluids.   You may resume your normal diet.   Begin with a light meal and progress to your normal diet.   Avoid alcoholic beverages for 24 hours or as instructed by your caregiver.  Medications You may resume your normal medications unless your caregiver tells you otherwise. What you can expect today  You may experience abdominal discomfort such as a feeling of fullness or "gas" pains.   You may experience a sore throat for 2 to 3 days. This is normal. Gargling with salt water may help this.  Follow-up Your doctor will discuss the results of your test with you. SEEK IMMEDIATE MEDICAL CARE IF:  You have excessive nausea (feeling sick to your stomach) and/or vomiting.   You have severe abdominal pain and distention (swelling).   You have trouble swallowing.   You have a  temperature over 100 F (37.8 C).   You have rectal bleeding or vomiting of blood.  Document Released: 09/09/2003 Document Revised: 01/14/2011 Document Reviewed: 03/22/2007

## 2020-06-05 ENCOUNTER — Other Ambulatory Visit (HOSPITAL_COMMUNITY)
Admission: RE | Admit: 2020-06-05 | Discharge: 2020-06-05 | Disposition: A | Discharge status: Home / Self Care | Payer: Managed Care, Other (non HMO) | Source: Ambulatory Visit | Attending: Gastroenterology | Admitting: Gastroenterology

## 2020-06-05 ENCOUNTER — Other Ambulatory Visit: Payer: Self-pay

## 2020-06-05 ENCOUNTER — Encounter (HOSPITAL_COMMUNITY): Payer: Self-pay

## 2020-06-05 ENCOUNTER — Other Ambulatory Visit (INDEPENDENT_AMBULATORY_CARE_PROVIDER_SITE_OTHER): Payer: Self-pay

## 2020-06-05 ENCOUNTER — Encounter (HOSPITAL_COMMUNITY)
Admission: RE | Admit: 2020-06-05 | Discharge: 2020-06-05 | Disposition: A | Discharge status: Home / Self Care | Payer: Managed Care, Other (non HMO) | Source: Ambulatory Visit | Attending: Gastroenterology | Admitting: Gastroenterology

## 2020-06-05 DIAGNOSIS — Z01812 Encounter for preprocedural laboratory examination: Secondary | ICD-10-CM | POA: Insufficient documentation

## 2020-06-05 DIAGNOSIS — Z20822 Contact with and (suspected) exposure to covid-19: Secondary | ICD-10-CM | POA: Diagnosis not present

## 2020-06-05 HISTORY — DX: Gastro-esophageal reflux disease without esophagitis: K21.9

## 2020-06-05 HISTORY — DX: Barrett's esophagus without dysplasia: K22.70

## 2020-06-05 LAB — SARS CORONAVIRUS 2 (TAT 6-24 HRS): SARS Coronavirus 2: NEGATIVE

## 2020-06-06 ENCOUNTER — Encounter (HOSPITAL_COMMUNITY)
Admission: RE | Disposition: A | Discharge status: Home / Self Care | Payer: Self-pay | Source: Home / Self Care | Attending: Gastroenterology

## 2020-06-06 ENCOUNTER — Ambulatory Visit (HOSPITAL_COMMUNITY)
Admission: RE | Admit: 2020-06-06 | Discharge: 2020-06-06 | Disposition: A | Discharge status: Home / Self Care | Payer: Managed Care, Other (non HMO) | Attending: Gastroenterology | Admitting: Gastroenterology

## 2020-06-06 ENCOUNTER — Ambulatory Visit (HOSPITAL_COMMUNITY): Payer: Managed Care, Other (non HMO) | Admitting: Anesthesiology

## 2020-06-06 DIAGNOSIS — K317 Polyp of stomach and duodenum: Secondary | ICD-10-CM | POA: Diagnosis not present

## 2020-06-06 DIAGNOSIS — K219 Gastro-esophageal reflux disease without esophagitis: Secondary | ICD-10-CM

## 2020-06-06 DIAGNOSIS — Z79899 Other long term (current) drug therapy: Secondary | ICD-10-CM | POA: Diagnosis not present

## 2020-06-06 DIAGNOSIS — K449 Diaphragmatic hernia without obstruction or gangrene: Secondary | ICD-10-CM | POA: Diagnosis not present

## 2020-06-06 DIAGNOSIS — K2100 Gastro-esophageal reflux disease with esophagitis, without bleeding: Secondary | ICD-10-CM | POA: Insufficient documentation

## 2020-06-06 DIAGNOSIS — Z8719 Personal history of other diseases of the digestive system: Secondary | ICD-10-CM | POA: Diagnosis not present

## 2020-06-06 DIAGNOSIS — K3189 Other diseases of stomach and duodenum: Secondary | ICD-10-CM

## 2020-06-06 DIAGNOSIS — M109 Gout, unspecified: Secondary | ICD-10-CM | POA: Insufficient documentation

## 2020-06-06 HISTORY — PX: ESOPHAGOGASTRODUODENOSCOPY (EGD) WITH PROPOFOL: SHX5813

## 2020-06-06 HISTORY — PX: BIOPSY: SHX5522

## 2020-06-06 SURGERY — ESOPHAGOGASTRODUODENOSCOPY (EGD) WITH PROPOFOL
Anesthesia: General

## 2020-06-06 MED ORDER — LIDOCAINE HCL (CARDIAC) PF 50 MG/5ML IV SOSY
PREFILLED_SYRINGE | INTRAVENOUS | Status: DC | PRN
  Administered 2020-06-06: 50 mg via INTRAVENOUS

## 2020-06-06 MED ORDER — LACTATED RINGERS IV SOLN: INTRAVENOUS | Status: DC

## 2020-06-06 MED ORDER — PROPOFOL 10 MG/ML IV BOLUS
INTRAVENOUS | Status: DC | PRN
  Administered 2020-06-06 (×2): 120 mg via INTRAVENOUS
  Administered 2020-06-06: 200 mg via INTRAVENOUS
  Administered 2020-06-06: 100 mg via INTRAVENOUS

## 2020-06-06 NOTE — Transfer of Care (Signed)
Immediate Anesthesia Transfer of Care Note  Patient: Barry Robertson  Procedure(s) Performed: ESOPHAGOGASTRODUODENOSCOPY (EGD) WITH PROPOFOL (N/A ) BIOPSY  Patient Location: Short Stay  Anesthesia Type:General  Level of Consciousness: awake and patient cooperative  Airway & Oxygen Therapy: Patient Spontanous Breathing  Post-op Assessment: Report given to RN and Post -op Vital signs reviewed and stable  Post vital signs: Reviewed and stable  Last Vitals:  Vitals Value Taken Time  BP    Temp 97.4   Pulse    Resp    SpO2      Last Pain:  Vitals:   06/06/20 1131  TempSrc:   PainSc: 0-No pain      Patients Stated Pain Goal: 8 (06/06/20 1045)  Complications: No complications documented.

## 2020-06-06 NOTE — Op Note (Addendum)
[facility] Patient Name: Barry Robertson Procedure Date: 06/06/2020 11:21 AM MRN: [id number] Date of Birth: 11-02-1980 Attending MD: Katrinka Blazing ,  CSN: [id number] Age: 40 Admit Type: Outpatient Procedure:                Upper GI endoscopy Indications:              Gastro-esophageal reflux disease, Follow-up of                            Barrett's esophagus Providers:                Katrinka Blazing, [place] Scrape RN, RN,                            Dyann Ruddle Referring MD:              Medicines:                Monitored Anesthesia Care Complications:            No immediate complications. Estimated Blood Loss:     Estimated blood loss: none. Procedure:                Pre-Anesthesia Assessment:                           - Prior to the procedure, a History and Physical                            was performed, and patient medications, allergies                            and sensitivities were reviewed. The patient's                            tolerance of previous anesthesia was reviewed.                           - The risks and benefits of the procedure and the                            sedation options and risks were discussed with the                            patient. All questions were answered and informed                            consent was obtained.                           - ASA Grade Assessment: II - A patient with mild                            systemic disease.                           After obtaining informed consent, the endoscope was  passed under direct vision. Throughout the                            procedure, the patient's blood pressure, pulse, and                            oxygen saturations were monitored continuously. The                            [id number]) was introduced through the mouth,                            and advanced to the second part of duodenum. The                             upper GI endoscopy was accomplished without                            difficulty. The patient tolerated the procedure                            well. Scope In: 11:32:16 AM Scope Out: 11:39:28 AM Total Procedure Duration: 0 hours 7 minutes 12 seconds  Findings:      A 2 cm hiatal hernia was present. No endoscopic evidence of Barrett's       esophagus on examination with NBI.      A single 6 mm mucosal papule (nodule) with no bleeding was found in the       gastric antrum. Imaging was performed using white light and narrow band       imaging to visualize the mucosa. Biopsies were taken from body, antrum       and nodule with a cold forceps for histology and H. pylori.      The examined duodenum was normal. Impression:               - 2 cm hiatal hernia.                           - A single mucosal papule (nodule) found in the                            stomach. Biopsied.                           - Normal examined duodenum. Moderate Sedation:      Per Anesthesia Care Recommendation:           - Discharge patient to home (ambulatory).                           - Resume previous diet.                           - Await pathology results.                           - Continue present medications.                           -  Repeat EGD in 3 years. Procedure Code(s):        --- Professional ---                           [phone], Esophagogastroduodenoscopy, flexible,                            transoral; with biopsy, single or multiple Diagnosis Code(s):        --- Professional ---                           K44.9, Diaphragmatic hernia without obstruction or                            gangrene                           K31.89, Other diseases of stomach and duodenum                           K21.9, Gastro-esophageal reflux disease without                            esophagitis CPT copyright 2019 American Medical Association. All rights reserved. The codes documented in this report are  preliminary and upon coder review may  be revised to meet current compliance requirements. Katrinka Blazing, MD Katrinka Blazing,  06/06/2020 11:43:02 AM This report has been signed electronically. Number of Addenda: 0

## 2020-06-06 NOTE — Anesthesia Postprocedure Evaluation (Signed)
Anesthesia Post Note  Patient: Barry Robertson  Procedure(s) Performed: ESOPHAGOGASTRODUODENOSCOPY (EGD) WITH PROPOFOL (N/A ) BIOPSY  Patient location during evaluation: Phase II Anesthesia Type: General Level of consciousness: awake and alert and oriented Pain management: pain level controlled Vital Signs Assessment: post-procedure vital signs reviewed and stable Respiratory status: spontaneous breathing and respiratory function stable Cardiovascular status: stable and blood pressure returned to baseline Postop Assessment: no apparent nausea or vomiting Anesthetic complications: no   No complications documented.   Last Vitals:  Vitals:   06/06/20 1045 06/06/20 1152  BP: 124/80 (!) 91/59  Pulse: 65 85  Resp: 18 20  Temp: 36.7 C (!) 36.3 C  SpO2: 98% 94%    Last Pain:  Vitals:   06/06/20 1152  TempSrc: Oral  PainSc: 0-No pain                 Lenox Bink C Paradise Vensel

## 2020-06-06 NOTE — Anesthesia Procedure Notes (Signed)
Date/Time: 06/06/2020 11:27 AM Performed by: Franco Nones, CRNA Pre-anesthesia Checklist: Patient identified, Emergency Drugs available, Suction available, Timeout performed and Patient being monitored Patient Re-evaluated:Patient Re-evaluated prior to induction Oxygen Delivery Method: Nasal Cannula

## 2020-06-06 NOTE — Discharge Instructions (Signed)
Upper Endoscopy, Adult, Care After This sheet gives you information about how to care for yourself after your procedure. Your health care provider may also give you more specific instructions. If you have problems or questions, contact your health care provider. What can I expect after the procedure? After the procedure, it is common to have:  A sore throat.  Mild stomach pain or discomfort.  Bloating.  Nausea. Follow these instructions at home:  Follow instructions from your health care provider about what to eat or drink after your procedure.  Return to your normal activities as told by your health care provider. Ask your health care provider what activities are safe for you.  Take over-the-counter and prescription medicines only as told by your health care provider.  If you were given a sedative during the procedure, it can affect you for several hours. Do not drive or operate machinery until your health care provider says that it is safe.  Keep all follow-up visits as told by your health care provider. This is important.   Contact a health care provider if you have:  A sore throat that lasts longer than one day.  Trouble swallowing. Get help right away if:  You vomit blood or your vomit looks like coffee grounds.  You have: ? A fever. ? Bloody, black, or tarry stools. ? A severe sore throat or you cannot swallow. ? Difficulty breathing. ? Severe pain in your chest or abdomen. Summary  After the procedure, it is common to have a sore throat, mild stomach discomfort, bloating, and nausea.  If you were given a sedative during the procedure, it can affect you for several hours. Do not drive or operate machinery until your health care provider says that it is safe.  Follow instructions from your health care provider about what to eat or drink after your procedure.  Return to your normal activities as told by your health care provider. This information is not intended to  replace advice given to you by your health care provider. Make sure you discuss any questions you have with your health care provider. Document Revised: 01/23/2019 Document Reviewed: 06/27/2017 Elsevier Patient Education  2021 Elsevier Inc[employer] PATIENT INSTRUCTIONS POST-ANESTHESIA  IMMEDIATELY FOLLOWING SURGERY:  Do not drive or operate machinery for the first twenty four hours after surgery.  Do not make any important decisions for twenty four hours after surgery or while taking narcotic pain medications or sedatives.  If you develop intractable nausea and vomiting or a severe headache please notify your doctor immediately.  FOLLOW-UP:  Please make an appointment with your surgeon as instructed. You do not need to follow up with anesthesia unless specifically instructed to do so.  WOUND CARE INSTRUCTIONS (if applicable):  Keep a dry clean dressing on the anesthesia/puncture wound site if there is drainage.  Once the wound has quit draining you may leave it open to air.  Generally you should leave the bandage intact for twenty four hours unless there is drainage.  If the epidural site drains for more than 36-48 hours please call the anesthesia department.  QUESTIONS?:  Please feel free to call your physician or the hospital operator if you have any questions, and they will be happy to assist you.      You are being discharged to home.  Resume your previous diet.  We are waiting for your pathology results.  Continue your present medications.  Repeat EGD in 3 years.

## 2020-06-06 NOTE — Anesthesia Preprocedure Evaluation (Signed)
Anesthesia Evaluation  Patient identified by MRN, date of birth, ID band Patient awake    Reviewed: Allergy & Precautions, NPO status , Patient's Chart, lab work & pertinent test results  History of Anesthesia Complications Negative for: history of anesthetic complications  Airway Mallampati: II  TM Distance: >3 FB Neck ROM: Full    Dental  (+) Teeth Intact, Dental Advisory Given   Pulmonary neg pulmonary ROS,    Pulmonary exam normal breath sounds clear to auscultation       Cardiovascular Exercise Tolerance: Good Normal cardiovascular exam Rhythm:Regular Rate:Normal     Neuro/Psych negative neurological ROS  negative psych ROS   GI/Hepatic Neg liver ROS, GERD  Medicated and Controlled,  Endo/Other  negative endocrine ROS  Renal/GU negative Renal ROS     Musculoskeletal negative musculoskeletal ROS (+)   Abdominal   Peds  Hematology negative hematology ROS (+)   Anesthesia Other Findings   Reproductive/Obstetrics negative OB ROS                            Anesthesia Physical Anesthesia Plan  ASA: II  Anesthesia Plan: General   Post-op Pain Management:    Induction:   PONV Risk Score and Plan: Propofol infusion  Airway Management Planned: Nasal Cannula and Natural Airway  Additional Equipment:   Intra-op Plan:   Post-operative Plan:   Informed Consent: I have reviewed the patients History and Physical, chart, labs and discussed the procedure including the risks, benefits and alternatives for the proposed anesthesia with the patient or authorized representative who has indicated his/her understanding and acceptance.       Plan Discussed with: CRNA and Surgeon  Anesthesia Plan Comments:         Anesthesia Quick Evaluation

## 2020-06-06 NOTE — Interval H&P Note (Signed)
History and Physical Interval Note:  06/06/2020 11:19 AM  40 y.o. male with past medical history of GERD, gout and Barrett's esophagus without dysplasia, who presents for evaluation  of GERD.  Patient reports that he has been feeling well while taking his pantoprazole 40 mg every day and famotidine at night.  He denies having any heartburn or chest discomfort/pain, no dysphagia/odynophagia.  BP 124/80   Pulse 65   Temp 98 F (36.7 C) (Oral)   Resp 18   SpO2 98%  GENERAL: The patient is AO x3, in no acute distress. HEENT: Head is normocephalic and atraumatic. EOMI are intact. Mouth is well hydrated and without lesions. NECK: Supple. No masses LUNGS: Clear to auscultation. No presence of rhonchi/wheezing/rales. Adequate chest expansion HEART: RRR, normal s1 and s2. ABDOMEN: Soft, nontender, no guarding, no peritoneal signs, and nondistended. BS +. No masses. EXTREMITIES: Without any cyanosis, clubbing, rash, lesions or edema. NEUROLOGIC: AOx3, no focal motor deficit. SKIN: no jaundice, no rashes   Barry Robertson  has presented today for surgery, with the diagnosis of Barretts Esophagus GERD.  The various methods of treatment have been discussed with the patient and family. After consideration of risks, benefits and other options for treatment, the patient has consented to  Procedure(s) with comments: ESOPHAGOGASTRODUODENOSCOPY (EGD) WITH PROPOFOL (N/A) - AM as a surgical intervention.  The patient's history has been reviewed, patient examined, no change in status, stable for surgery.  I have reviewed the patient's chart and labs.  Questions were answered to the patient's satisfaction.     Katrinka Blazing [place]

## 2020-06-09 LAB — SURGICAL PATHOLOGY

## 2020-06-11 ENCOUNTER — Encounter (HOSPITAL_COMMUNITY): Payer: Self-pay | Admitting: Gastroenterology

## 2020-11-07 ENCOUNTER — Other Ambulatory Visit (INDEPENDENT_AMBULATORY_CARE_PROVIDER_SITE_OTHER): Payer: Self-pay | Admitting: Gastroenterology

## 2020-11-07 DIAGNOSIS — K2270 Barrett's esophagus without dysplasia: Secondary | ICD-10-CM

## 2020-11-07 DIAGNOSIS — K219 Gastro-esophageal reflux disease without esophagitis: Secondary | ICD-10-CM

## 2021-05-06 ENCOUNTER — Other Ambulatory Visit (INDEPENDENT_AMBULATORY_CARE_PROVIDER_SITE_OTHER): Payer: Self-pay | Admitting: Gastroenterology

## 2021-05-06 DIAGNOSIS — K2270 Barrett's esophagus without dysplasia: Secondary | ICD-10-CM

## 2021-05-06 DIAGNOSIS — K219 Gastro-esophageal reflux disease without esophagitis: Secondary | ICD-10-CM

## 2021-05-24 IMAGING — CR DG CHEST 2V
2 series · 2 of 2 positions shown · non-contrast
Comparison: None.

CLINICAL DATA: Cough, shortness of breath, [id number]-ZL positive

EXAM:
CHEST - 2 VIEW

[w pa chest]
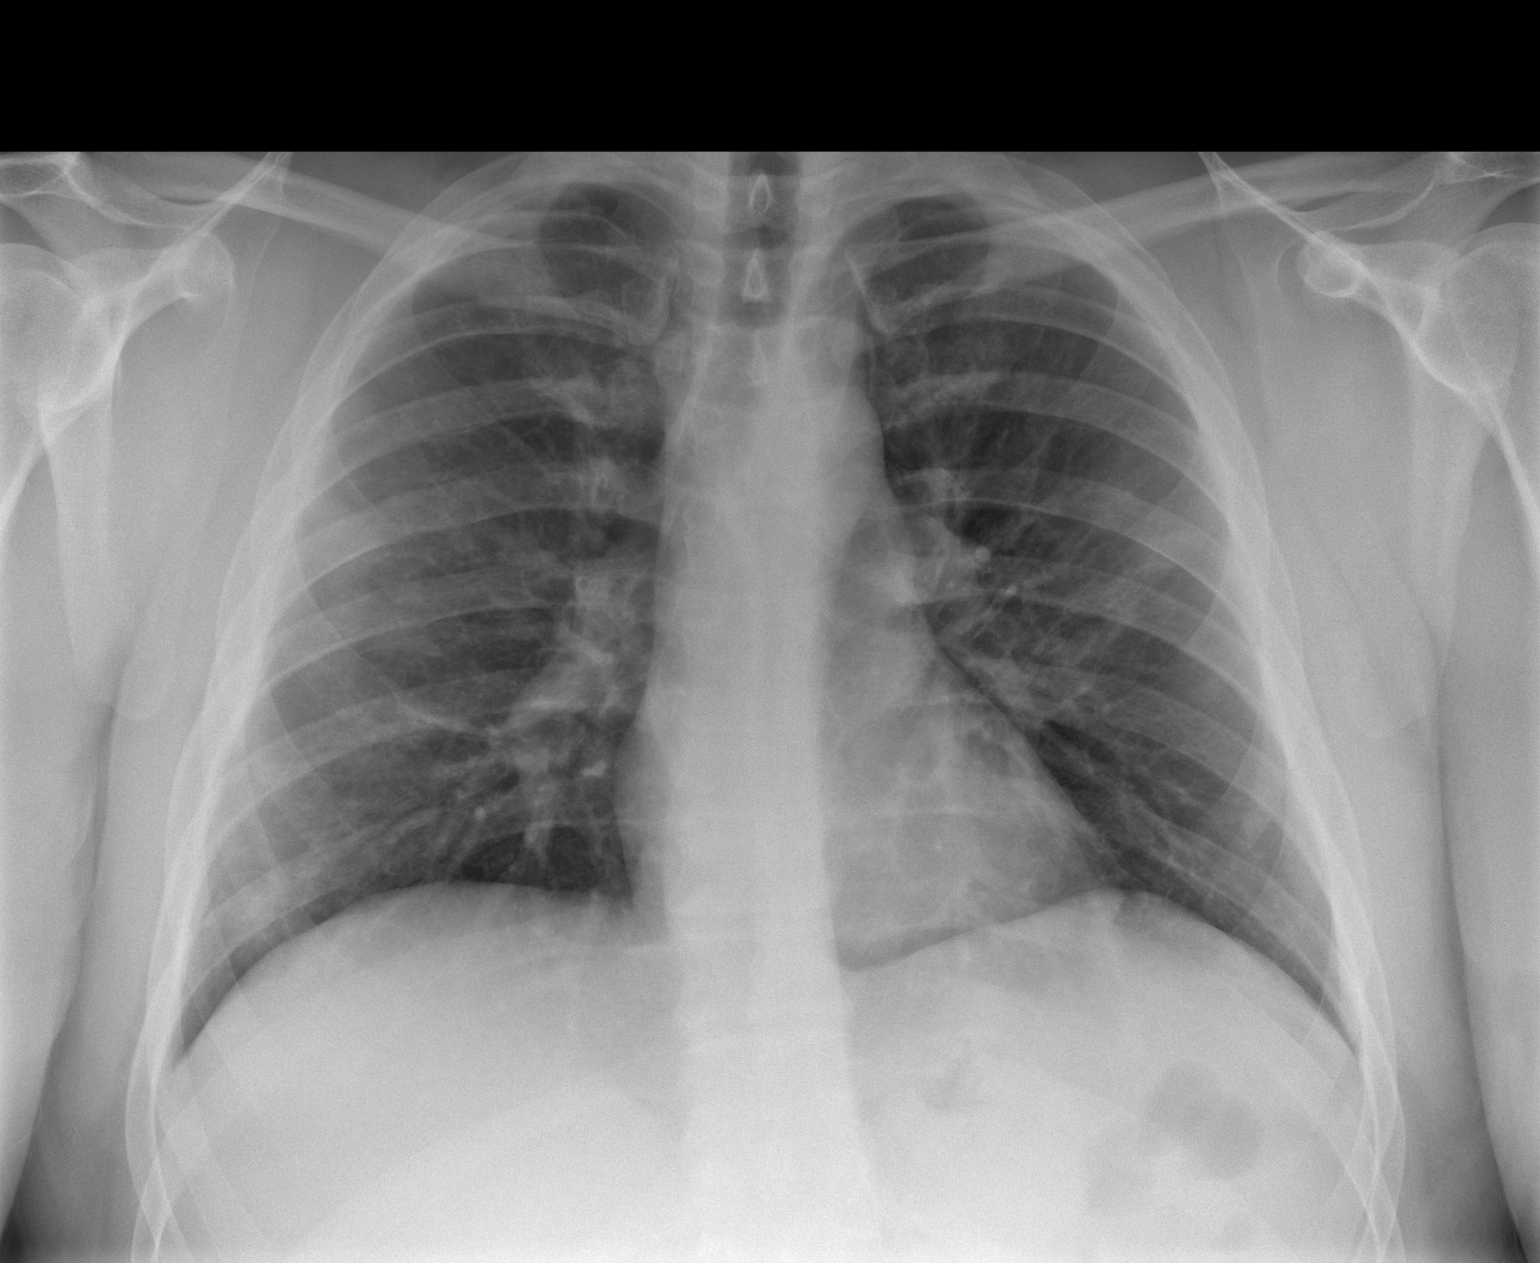

[w chest lat]
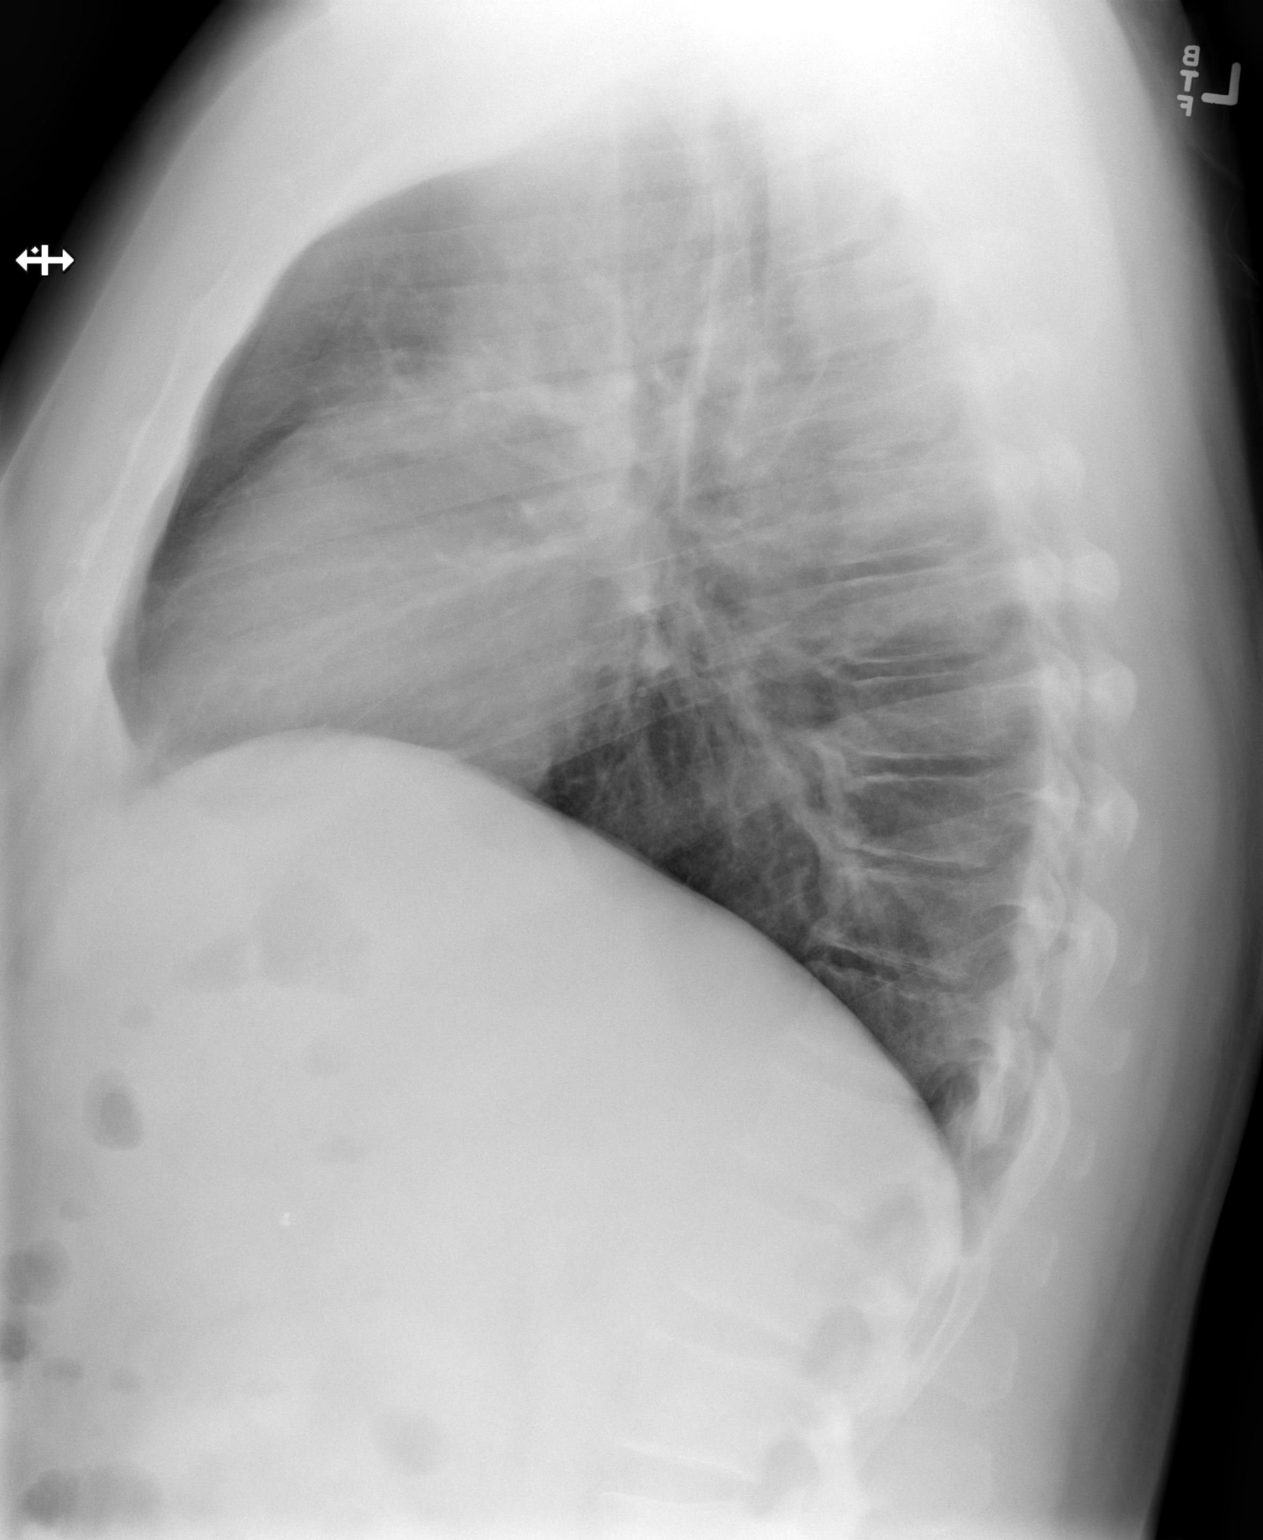

[2 of 2 positions shown; findings below may reference images not displayed]

FINDINGS: The heart size and mediastinal contours are within normal limits.
Mildly prominent interstitial markings throughout both lungs. No
pleural effusion or pneumothorax. The visualized skeletal structures
are unremarkable.
IMPRESSION: Mildly prominent interstitial markings throughout both lungs
suspicious for developing atypical/viral infection.

## 2021-05-28 ENCOUNTER — Other Ambulatory Visit (INDEPENDENT_AMBULATORY_CARE_PROVIDER_SITE_OTHER): Payer: Self-pay | Admitting: Gastroenterology

## 2021-05-28 ENCOUNTER — Other Ambulatory Visit (HOSPITAL_COMMUNITY): Payer: Self-pay

## 2021-05-28 ENCOUNTER — Other Ambulatory Visit (INDEPENDENT_AMBULATORY_CARE_PROVIDER_SITE_OTHER): Payer: Self-pay | Admitting: *Deleted

## 2021-05-28 ENCOUNTER — Telehealth (INDEPENDENT_AMBULATORY_CARE_PROVIDER_SITE_OTHER): Payer: Self-pay

## 2021-05-28 DIAGNOSIS — K219 Gastro-esophageal reflux disease without esophagitis: Secondary | ICD-10-CM

## 2021-05-28 DIAGNOSIS — K2270 Barrett's esophagus without dysplasia: Secondary | ICD-10-CM

## 2021-05-28 MED ORDER — PANTOPRAZOLE SODIUM 40 MG PO TBEC: DELAYED_RELEASE_TABLET | ORAL | 0 refills | Status: DC

## 2021-05-28 MED ORDER — PANTOPRAZOLE SODIUM 40 MG PO TBEC
DELAYED_RELEASE_TABLET | ORAL | 0 refills | Status: DC
  Filled 2021-05-28: qty 90, fill #0

## 2021-05-28 NOTE — Telephone Encounter (Signed)
Will refill medication for 3 months, needs follow up appointment with any provider in order to receive any refills. ? ?Thanks, ? ?Sariya Trickey Castaneda, MD ?Gastroenterology and Hepatology ?[facility] ? ?

## 2021-05-28 NOTE — Telephone Encounter (Signed)
Patient calling today needing refills on Pantoprazole 40 mg daily. He says [place] is going [place] and would like to have refills sent in to [facility] [address]. He has not been seen since he had a EGD done on 06/06/2020. I advised he would need an office visit and he was transferred to [facility] to set up an appointment.  Please advise.  ?

## 2021-05-28 NOTE — Telephone Encounter (Signed)
Patient aware of all.

## 2021-07-16 ENCOUNTER — Ambulatory Visit (INDEPENDENT_AMBULATORY_CARE_PROVIDER_SITE_OTHER): Payer: Managed Care, Other (non HMO) | Admitting: Gastroenterology

## 2021-07-16 ENCOUNTER — Encounter (INDEPENDENT_AMBULATORY_CARE_PROVIDER_SITE_OTHER): Payer: Self-pay | Admitting: Gastroenterology

## 2021-07-16 VITALS — BP 134/84 | HR 58 | Temp 98.0°F | Ht 70.0 in | Wt 251.3 lb

## 2021-07-16 DIAGNOSIS — K2270 Barrett's esophagus without dysplasia: Secondary | ICD-10-CM | POA: Diagnosis not present

## 2021-07-16 DIAGNOSIS — K219 Gastro-esophageal reflux disease without esophagitis: Secondary | ICD-10-CM | POA: Diagnosis not present

## 2021-07-16 DIAGNOSIS — K449 Diaphragmatic hernia without obstruction or gangrene: Secondary | ICD-10-CM | POA: Diagnosis not present

## 2021-07-16 MED ORDER — PANTOPRAZOLE SODIUM 40 MG PO TBEC: 40.0000 mg | DELAYED_RELEASE_TABLET | Freq: Two times a day (BID) | ORAL | 3 refills | Status: DC

## 2021-07-16 NOTE — Patient Instructions (Signed)
Increase pantoprazole to 40 mg twice a day, stop famotidine for now. If worsening symptoms, go back to pantoprazole in PM and famotidine Can read about Transoral Incisionless Fundoplication (TIF) and Nissen fundoplication , and reach [place] if interested

## 2021-07-16 NOTE — Progress Notes (Signed)
Katrinka Blazing, M.D. Gastroenterology & Hepatology [facility] [address] [place], [place] [address]  Primary Care Physician: Benita Stabile, MD [address] [place] [address]  I will communicate my assessment and recommendations to the referring MD via EMR.  Problems: GERD  History of Present Illness: Barry Robertson is a 41 y.o. male with past medical history of GERD, gout and Barrett's esophagus without dysplasia, who presents for follow up of GERD.  The patient was last seen on 05/12/2020. At that time, the patient was scheduled for EGD and continued on pantoprazole/famotidine. EGD with findings below  Patient reports that he is taking 40 mg pantoprazole and OTC 20 mg famotidine every night. If he takes it in the morning, the medication does not work that well. States that 85% of the times he has adequate control of his symptoms, but sometimes when he eats [place] food he has flare of symptoms, especially at 2-3 AM. Patient may have 2 episodes per month.  The patient denies having any nausea, vomiting, fever, chills, hematochezia, melena, hematemesis, abdominal distention, abdominal pain, diarrhea, jaundice, pruritus or weight loss.  Last EGD: 06/06/2020 - 2 cm hiatal hernia. No Barrett's esophagus was seen - A single mucosal papule (nodule) found in the stomach. Biopsied. - Normal examined duodenum.  Path: A. STOMACH, NODULE, BIOPSY:  - Hyperplastic gastric mucosa with with active inflammation and reactive  changes.  - No intestinal metaplasia, adenomatous change or carcinoma.   B. STOMACH, RANDOM, BIOPSY:  - Antral and oxyntic mucosa with mild inflammation and reactive changes.  - Warthin-Starry negative for Helicobacter pylori.  - No intestinal metaplasia, dysplasia or carcinoma.   Reccomended repeat EGD in 3 years  Last Colonoscopy: Never  Past Medical History: Past Medical History:  Diagnosis Date    Barrett's esophagus    Gallstones    GERD (gastroesophageal reflux disease)    Gout     Past Surgical History: Past Surgical History:  Procedure Laterality Date   BIOPSY  11/05/2016   Procedure: BIOPSY;  Surgeon: Malissa Hippo, MD;  [facility];  Service: Endoscopy;;  esophagus antrum   BIOPSY  06/06/2020   Procedure: BIOPSY;  Surgeon: Dolores Frame, MD;  [facility];  Service: Gastroenterology;;   CHOLECYSTECTOMY N/A 02/03/2018   Procedure: LAPAROSCOPIC CHOLECYSTECTOMY;  Surgeon: Franky Macho, MD;  [facility];  Service: General;  Laterality: N/A;   ESOPHAGOGASTRODUODENOSCOPY N/A 11/05/2016   Procedure: ESOPHAGOGASTRODUODENOSCOPY (EGD);  Surgeon: Malissa Hippo, MD;  [facility];  Service: Endoscopy;  Laterality: N/A;  10:50   ESOPHAGOGASTRODUODENOSCOPY (EGD) WITH PROPOFOL N/A 06/06/2020   Procedure: ESOPHAGOGASTRODUODENOSCOPY (EGD) WITH PROPOFOL;  Surgeon: Dolores Frame, MD;  [facility];  Service: Gastroenterology;  Laterality: N/A;  AM   WRIST SURGERY Left     Family History:History reviewed. No pertinent family history.  Social History: Social History   Tobacco Use  Smoking Status Never  Smokeless Tobacco Never   Social History   Substance and Sexual Activity  Alcohol Use Yes   Comment: rarely   Social History   Substance and Sexual Activity  Drug Use No    Allergies: No Known Allergies  Medications: Current Outpatient Medications  Medication Sig Dispense Refill   famotidine (PEPCID) 40 MG tablet Take 1 tablet (40 mg total) by mouth daily. 90 tablet 3   pantoprazole (PROTONIX) 40 MG tablet Take one tablet daily 90 tablet 0  No current facility-administered medications for this visit.    Review of Systems: GENERAL: negative for malaise, night sweats HEENT: No changes in hearing or vision, no nose bleeds or other nasal problems. NECK: Negative for lumps, goiter, pain and  significant neck swelling RESPIRATORY: Negative for cough, wheezing CARDIOVASCULAR: Negative for chest pain, leg swelling, palpitations, orthopnea GI: SEE HPI MUSCULOSKELETAL: Negative for joint pain or swelling, back pain, and muscle pain. SKIN: Negative for lesions, rash PSYCH: Negative for sleep disturbance, mood disorder and recent psychosocial stressors. HEMATOLOGY Negative for prolonged bleeding, bruising easily, and swollen nodes. ENDOCRINE: Negative for cold or heat intolerance, polyuria, polydipsia and goiter. NEURO: negative for tremor, gait imbalance, syncope and seizures. The remainder of the review of systems is noncontributory.   Physical Exam: BP 134/84 (BP Location: Left Arm, Patient Position: Sitting, Cuff Size: Large)   Pulse (!) 58   Temp 98 F (36.7 C) (Oral)   Ht 5\' 10"  (1.778 m)   Wt 251 lb 4.8 oz (114 kg)   BMI 36.06 kg/m  GENERAL: The patient is AO x3, in no acute distress. HEENT: Head is normocephalic and atraumatic. EOMI are intact. Mouth is well hydrated and without lesions. NECK: Supple. No masses LUNGS: Clear to auscultation. No presence of rhonchi/wheezing/rales. Adequate chest expansion HEART: RRR, normal s1 and s2. ABDOMEN: Soft, nontender, no guarding, no peritoneal signs, and nondistended. BS +. No masses. EXTREMITIES: Without any cyanosis, clubbing, rash, lesions or edema. NEUROLOGIC: AOx3, no focal motor deficit. SKIN: no jaundice, no rashes  Imaging/Labs: as above  I personally reviewed and interpreted the available labs, imaging and endoscopic files.  Impression and Plan: Barry Robertson is a 41 y.o. male with past medical history of GERD, gout and Barrett's esophagus without dysplasia, who presents for follow up of GERD.  The patient has presented improvement of his symptoms with the combination of pantoprazole and famotidine but he is still having some intermittent episodes of heartburn occasionally.  We discussed the possibility of  increasing his PPI to twice a day to improve his symptom control and stopping famotidine for now.  If his symptoms are the same or worse, he will go back to his previous regimen of pantoprazole 40 mg at night and famotidine at night.  The patient and I held a thorough discussion about potential nonpharmacologic treatments for reflux which include Transoral Incisionless Fundoplication (TIF) and Nissen fundoplication.  The benefits and risks, as well as prognosis with the use of the different modalities was thoroughly discussed with the patient who understood and agreed.  The patient will read more about these procedures and will let me know if interested to pursue this in the future.  - Increase pantoprazole to 40 mg twice a day, stop famotidine for now. If worsening symptoms, go back to pantoprazole in PM and famotidine - Can read about Transoral Incisionless Fundoplication (TIF) and Nissen fundoplication , and reach 46 if interested  All questions were answered.      [place], MD Gastroenterology and Hepatology [facility]

## 2021-08-04 ENCOUNTER — Other Ambulatory Visit (INDEPENDENT_AMBULATORY_CARE_PROVIDER_SITE_OTHER): Payer: Self-pay

## 2021-08-04 DIAGNOSIS — K219 Gastro-esophageal reflux disease without esophagitis: Secondary | ICD-10-CM

## 2021-08-04 DIAGNOSIS — K449 Diaphragmatic hernia without obstruction or gangrene: Secondary | ICD-10-CM

## 2021-08-04 MED ORDER — PANTOPRAZOLE SODIUM 40 MG PO TBEC: 40.0000 mg | DELAYED_RELEASE_TABLET | Freq: Two times a day (BID) | ORAL | 11 refills | Status: DC

## 2021-08-10 ENCOUNTER — Telehealth (INDEPENDENT_AMBULATORY_CARE_PROVIDER_SITE_OTHER): Payer: Self-pay | Admitting: *Deleted

## 2021-08-10 NOTE — Telephone Encounter (Signed)
Fax from [place]. Request to cover pantoprazole 40mg  tablet was approved from 07/27/21 - 07/27/22. I called and left message to return call to let him know and I also faxed letter of approval to [facility] in [place].   Patient request ID [id number].

## 2021-08-12 NOTE — Telephone Encounter (Signed)
Patient aware.

## 2022-03-24 ENCOUNTER — Ambulatory Visit: Payer: Managed Care, Other (non HMO) | Admitting: Podiatry

## 2022-03-24 ENCOUNTER — Encounter: Payer: Self-pay | Admitting: Podiatry

## 2022-03-24 DIAGNOSIS — B070 Plantar wart: Secondary | ICD-10-CM

## 2022-03-24 MED ORDER — FLUOROURACIL 5 % EX CREA: TOPICAL_CREAM | Freq: Two times a day (BID) | CUTANEOUS | 1 refills | Status: DC

## 2022-03-24 NOTE — Progress Notes (Signed)
Subjective:  Patient ID: Barry Robertson, male    DOB: [birth year],  MRN: [id number] HPI Chief Complaint  Patient presents with   Foot Pain    Plantar forefoot/hallux right - multiple callused lesions x few months, tried OTC med, but made foot really raw   New Patient (Initial Visit)    42 y.o. male presents with the above complaint.   ROS: Denies fever chills nausea vomit muscle aches pains calf pain back pain chest pain shortness of breath  Past Medical History:  Diagnosis Date   Barrett's esophagus    Gallstones    GERD (gastroesophageal reflux disease)    Gout    Past Surgical History:  Procedure Laterality Date   BIOPSY  11/05/2016   Procedure: BIOPSY;  Surgeon: Rogene Houston, MD;  [facility];  Service: Endoscopy;;  esophagus antrum   BIOPSY  06/06/2020   Procedure: BIOPSY;  Surgeon: Harvel Quale, MD;  [facility];  Service: Gastroenterology;;   CHOLECYSTECTOMY N/A 02/03/2018   Procedure: LAPAROSCOPIC CHOLECYSTECTOMY;  Surgeon: Aviva Signs, MD;  [facility];  Service: General;  Laterality: N/A;   ESOPHAGOGASTRODUODENOSCOPY N/A 11/05/2016   Procedure: ESOPHAGOGASTRODUODENOSCOPY (EGD);  Surgeon: Rogene Houston, MD;  [facility];  Service: Endoscopy;  Laterality: N/A;  10:50   ESOPHAGOGASTRODUODENOSCOPY (EGD) WITH PROPOFOL N/A 06/06/2020   Procedure: ESOPHAGOGASTRODUODENOSCOPY (EGD) WITH PROPOFOL;  Surgeon: Harvel Quale, MD;  [facility];  Service: Gastroenterology;  Laterality: N/A;  AM   WRIST SURGERY Left     Current Outpatient Medications:    fluorouracil (EFUDEX) 5 % cream, Apply topically 2 (two) times daily., Disp: 40 g, Rfl: 1   famotidine (PEPCID) 40 MG tablet, Take 1 tablet (40 mg total) by mouth daily., Disp: 90 tablet, Rfl: 3   pantoprazole (PROTONIX) 40 MG tablet, Take 1 tablet (40 mg total) by mouth 2 (two) times daily. Take one tablet daily, Disp: 60 tablet, Rfl: 11  No Known  Allergies Review of Systems Objective:  There were no vitals filed for this visit.  General: Well developed, nourished, in no acute distress, alert and oriented x3   Dermatological: Skin is warm, dry and supple bilateral. Nails x 10 are well maintained; remaining integument appears unremarkable at this time. There are no open sores, no preulcerative lesions, no rash or signs of infection present.  Multiple verrucoid lesions on plantar aspect of the forefoot right.  Majority of them are located around the first metatarsophalangeal joint plantarly.  He does have a few on his personal lateral forefoot as well as the fourth toe and the second toe.  None measure greater than 0.5 cm in diameter.  But are as small as 0.1 cm.  No lesions to the contralateral foot.  Vascular: Dorsalis Pedis artery and Posterior Tibial artery pedal pulses are 2/4 bilateral with immedate capillary fill time. Pedal hair growth present. No varicosities and no lower extremity edema present bilateral.   Neruologic: Grossly intact via light touch bilateral. Vibratory intact via tuning fork bilateral. Protective threshold with Semmes Wienstein monofilament intact to all pedal sites bilateral. Patellar and Achilles deep tendon reflexes 2+ bilateral. No Babinski or clonus noted bilateral.   Musculoskeletal: No gross boney pedal deformities bilateral. No pain, crepitus, or limitation noted with foot and ankle range of motion bilateral. Muscular strength 5/5 in all groups tested bilateral.  Gait: Unassisted, Nonantalgic.    Radiographs:  None taken  Assessment & Plan:   Assessment: Verruca plantaris right  foot  Plan: At this point start him on Efudex cream to be applied twice daily I will follow-up with him in 6 weeks if no improvement then we will consider laser assisted excision.     Jhordan Mckibben T. Rupert, [place]

## 2022-05-13 ENCOUNTER — Ambulatory Visit: Payer: Managed Care, Other (non HMO) | Admitting: Podiatry

## 2022-07-03 ENCOUNTER — Other Ambulatory Visit (INDEPENDENT_AMBULATORY_CARE_PROVIDER_SITE_OTHER): Payer: Self-pay | Admitting: Gastroenterology

## 2022-07-03 DIAGNOSIS — K449 Diaphragmatic hernia without obstruction or gangrene: Secondary | ICD-10-CM

## 2022-07-03 DIAGNOSIS — K219 Gastro-esophageal reflux disease without esophagitis: Secondary | ICD-10-CM

## 2022-07-19 ENCOUNTER — Encounter (INDEPENDENT_AMBULATORY_CARE_PROVIDER_SITE_OTHER): Payer: Self-pay | Admitting: Gastroenterology

## 2022-07-19 ENCOUNTER — Ambulatory Visit (INDEPENDENT_AMBULATORY_CARE_PROVIDER_SITE_OTHER): Payer: Managed Care, Other (non HMO) | Admitting: Gastroenterology

## 2022-07-19 VITALS — BP 142/88 | HR 79 | Temp 97.1°F | Ht 71.0 in | Wt 260.3 lb

## 2022-07-19 DIAGNOSIS — K219 Gastro-esophageal reflux disease without esophagitis: Secondary | ICD-10-CM

## 2022-07-19 DIAGNOSIS — K449 Diaphragmatic hernia without obstruction or gangrene: Secondary | ICD-10-CM

## 2022-07-19 NOTE — Patient Instructions (Addendum)
Continue pantoprazole 40 mg twice a day Continue famotidine 40 mg at bedtime Continue exercising and avoiding triggering foods The patient was found to have elevated blood pressure when vital signs were checked in the office. The blood pressure was rechecked by the nursing staff and it was found be persistently elevated >140/90 mmHg. I personally advised to the patient to follow up closely with PCP for hypertension control.

## 2022-07-19 NOTE — Progress Notes (Unsigned)
Katrinka Blazing, M.D. Gastroenterology & Hepatology [facility] [address] [place], [place] [address]  Primary Care Physician: Benita Stabile, MD [address] [place] [address]  I will communicate my assessment and recommendations to the referring MD via EMR.  Problems: GERD ?Barrett's esophagus   History of Present Illness: Barry Robertson is a 42 y.o. male with past medical history of GERD, gout and ?Barrett's esophagus without dysplasia, who presents for follow up of GERD.  The patient was last seen on 07/16/21. At that time, the patient patient was advised to increase pantoprazole to 40 mg twice a day and he was given information about TIF procedure.  Patient reports that he is currently taking pantoprazole 40 mg BID and famotidine 20 mg at night. Reports that he usually sleeps reclined to avoid issues at night. Most of the times the combination of PPI and anti H2 controls symptoms completely, states it works "99.9% of the time". No dysphagia, odynophagia. Has noticed that if he cuts down on his medication, he will have heartburn. He avoids ice cream and greasy foods as he knows it is a trigger for his symptoms.  The patient denies having any nausea, vomiting, fever, chills, hematochezia, melena, hematemesis, abdominal distention, abdominal pain, diarrhea, jaundice, pruritus. He has hbeen exercising for the last month and his weight is starting to go down again.   Last EGD: 06/06/2020 - 2 cm hiatal hernia. No Barrett's esophagus was seen - A single mucosal papule (nodule) found in the stomach. Biopsied. - Normal examined duodenum.   Path: A. STOMACH, NODULE, BIOPSY:  - Hyperplastic gastric mucosa with with active inflammation and reactive  changes.  - No intestinal metaplasia, adenomatous change or carcinoma.   B. STOMACH, RANDOM, BIOPSY:  - Antral and oxyntic mucosa with mild inflammation and reactive changes.  -  Warthin-Starry negative for Helicobacter pylori.  - No intestinal metaplasia, dysplasia or carcinoma.    Reccomended repeat EGD in 3 years   Last Colonoscopy: Never  Past Medical History: Past Medical History:  Diagnosis Date   Barrett's esophagus    Gallstones    GERD (gastroesophageal reflux disease)    Gout     Past Surgical History: Past Surgical History:  Procedure Laterality Date   BIOPSY  11/05/2016   Procedure: BIOPSY;  Surgeon: Malissa Hippo, MD;  [facility];  Service: Endoscopy;;  esophagus antrum   BIOPSY  06/06/2020   Procedure: BIOPSY;  Surgeon: Dolores Frame, MD;  [facility];  Service: Gastroenterology;;   CHOLECYSTECTOMY N/A 02/03/2018   Procedure: LAPAROSCOPIC CHOLECYSTECTOMY;  Surgeon: Franky Macho, MD;  [facility];  Service: General;  Laterality: N/A;   ESOPHAGOGASTRODUODENOSCOPY N/A 11/05/2016   Procedure: ESOPHAGOGASTRODUODENOSCOPY (EGD);  Surgeon: Malissa Hippo, MD;  [facility];  Service: Endoscopy;  Laterality: N/A;  10:50   ESOPHAGOGASTRODUODENOSCOPY (EGD) WITH PROPOFOL N/A 06/06/2020   Procedure: ESOPHAGOGASTRODUODENOSCOPY (EGD) WITH PROPOFOL;  Surgeon: Dolores Frame, MD;  [facility];  Service: Gastroenterology;  Laterality: N/A;  AM   WRIST SURGERY Left     Family History:History reviewed. No pertinent family history.  Social History: Social History   Tobacco Use  Smoking Status Never  Smokeless Tobacco Never   Social History   Substance and Sexual Activity  Alcohol Use Yes   Comment: rarely   Social History   Substance and Sexual Activity  Drug Use No    Allergies: No Known Allergies  Medications: Current Outpatient Medications  Medication Sig Dispense Refill   famotidine (PEPCID) 40 MG tablet Take 1 tablet (40 mg total) by mouth daily. 90 tablet 3   pantoprazole (PROTONIX) 40 MG tablet Take 1 tablet (40 mg total) by mouth 2 (two) times daily. 180  tablet 0   No current facility-administered medications for this visit.    Review of Systems: GENERAL: negative for malaise, night sweats HEENT: No changes in hearing or vision, no nose bleeds or other nasal problems. NECK: Negative for lumps, goiter, pain and significant neck swelling RESPIRATORY: Negative for cough, wheezing CARDIOVASCULAR: Negative for chest pain, leg swelling, palpitations, orthopnea GI: SEE HPI MUSCULOSKELETAL: Negative for joint pain or swelling, back pain, and muscle pain. SKIN: Negative for lesions, rash PSYCH: Negative for sleep disturbance, mood disorder and recent psychosocial stressors. HEMATOLOGY Negative for prolonged bleeding, bruising easily, and swollen nodes. ENDOCRINE: Negative for cold or heat intolerance, polyuria, polydipsia and goiter. NEURO: negative for tremor, gait imbalance, syncope and seizures. The remainder of the review of systems is noncontributory.   Physical Exam: BP (!) 142/88 (BP Location: Left Arm, Patient Position: Sitting, Cuff Size: Large)   Pulse 79   Temp (!) 97.1 F (36.2 C) (Temporal)   Ht 5\' 11"  (1.803 m)   Wt 260 lb 4.8 oz (118.1 kg)   BMI 36.30 kg/m  GENERAL: The patient is AO x3, in no acute distress. HEENT: Head is normocephalic and atraumatic. EOMI are intact. Mouth is well hydrated and without lesions. NECK: Supple. No masses LUNGS: Clear to auscultation. No presence of rhonchi/wheezing/rales. Adequate chest expansion HEART: RRR, normal s1 and s2. ABDOMEN: Soft, nontender, no guarding, no peritoneal signs, and nondistended. BS +. No masses. EXTREMITIES: Without any cyanosis, clubbing, rash, lesions or edema. NEUROLOGIC: AOx3, no focal motor deficit. SKIN: no jaundice, no rashes  Imaging/Labs: as above  I personally reviewed and interpreted the available labs, imaging and endoscopic files.  Impression and Plan: Barry Robertson is a 42 y.o. male with past medical history of GERD, gout and ?Barrett's  esophagus without dysplasia, who presents for follow up of GERD.  The patient has presented significant improvement of his symptom control while complaining PPI twice daily and famotidine at night.  Will continue with the current regimen for now but I strongly encouraged the patient to continue exercising to achieve further weight loss, which may help control his symptoms further.  We may consider decreasing the dosage in the future depending on symptom control.  -Continue pantoprazole 40 mg twice a day -Continue famotidine 40 mg at bedtime -Continue exercising and avoiding triggering foods -The patient was found to have elevated blood pressure when vital signs were checked in the office. The blood pressure was rechecked by the nursing staff and it was found be persistently elevated >140/90 mmHg. I personally advised to the patient to follow up closely with PCP for hypertension control.  All questions were answered.      Katrinka Blazing, MD Gastroenterology and Hepatology [facility]

## 2022-11-14 ENCOUNTER — Other Ambulatory Visit (INDEPENDENT_AMBULATORY_CARE_PROVIDER_SITE_OTHER): Payer: Self-pay | Admitting: Gastroenterology

## 2022-11-14 DIAGNOSIS — K449 Diaphragmatic hernia without obstruction or gangrene: Secondary | ICD-10-CM

## 2022-11-14 DIAGNOSIS — K219 Gastro-esophageal reflux disease without esophagitis: Secondary | ICD-10-CM

## 2023-02-24 ENCOUNTER — Other Ambulatory Visit (INDEPENDENT_AMBULATORY_CARE_PROVIDER_SITE_OTHER): Payer: Self-pay | Admitting: Gastroenterology

## 2023-02-24 DIAGNOSIS — K219 Gastro-esophageal reflux disease without esophagitis: Secondary | ICD-10-CM

## 2023-02-24 DIAGNOSIS — K449 Diaphragmatic hernia without obstruction or gangrene: Secondary | ICD-10-CM

## 2023-05-19 ENCOUNTER — Encounter (INDEPENDENT_AMBULATORY_CARE_PROVIDER_SITE_OTHER): Payer: Self-pay | Admitting: *Deleted

## 2023-07-02 ENCOUNTER — Other Ambulatory Visit (INDEPENDENT_AMBULATORY_CARE_PROVIDER_SITE_OTHER): Payer: Self-pay | Admitting: Gastroenterology

## 2023-07-02 DIAGNOSIS — K219 Gastro-esophageal reflux disease without esophagitis: Secondary | ICD-10-CM

## 2023-07-02 DIAGNOSIS — K449 Diaphragmatic hernia without obstruction or gangrene: Secondary | ICD-10-CM

## 2023-07-05 NOTE — Telephone Encounter (Signed)
 Responded by earlier approval.

## 2023-07-19 ENCOUNTER — Ambulatory Visit (INDEPENDENT_AMBULATORY_CARE_PROVIDER_SITE_OTHER): Payer: Managed Care, Other (non HMO) | Admitting: Gastroenterology

## 2023-07-19 ENCOUNTER — Encounter (INDEPENDENT_AMBULATORY_CARE_PROVIDER_SITE_OTHER): Payer: Self-pay | Admitting: Gastroenterology

## 2023-07-19 VITALS — BP 120/77 | HR 62 | Temp 97.6°F | Ht 70.0 in | Wt 260.9 lb

## 2023-07-19 DIAGNOSIS — Z8719 Personal history of other diseases of the digestive system: Secondary | ICD-10-CM

## 2023-07-19 DIAGNOSIS — K219 Gastro-esophageal reflux disease without esophagitis: Secondary | ICD-10-CM

## 2023-07-19 DIAGNOSIS — K2270 Barrett's esophagus without dysplasia: Secondary | ICD-10-CM

## 2023-07-19 NOTE — Patient Instructions (Signed)
 Continue protonix  40mg  twice daily Continue famotidine  40mg  nightly Be mindful of greasy, spicy, fried, citrus foods, caffeine, carbonated drinks, chocolate and alcohol as these can increase reflux symptoms Stay upright 2-3 hours after eating, prior to lying down and avoid eating late in the evenings. We will get you scheduled for EGD   Follow up 1 year  It was a pleasure to see you today. I want to create trusting relationships with patients and provide genuine, compassionate, and quality care. I truly value your feedback! please be on the lookout for a survey regarding your visit with me today. I appreciate your input about our visit and your time in completing this!    Briunna Leicht L. Takisha Pelle, MSN, APRN, AGNP-C Adult-Gerontology Nurse Practitioner [facility] at [facility]

## 2023-07-19 NOTE — Progress Notes (Signed)
 Referring Provider: Omie Bickers, MD Primary Care Physician:  Omie Bickers, MD Primary GI Physician: Dr. Sammi Crick   Chief Complaint  Patient presents with   Follow-up    Pt arrives for follow up. No issues/concerns at this time. No refills needed at the moment.    HPI:   JAQUILLE KAU is a 43 y.o. male with past medical history of GERD, gout and ?Barrett's esophagus without dysplasia   Patient presenting today for:  Follow up of GERD with Barrett's esophagus  Last seen June 2024, at that time taking protonix  40mg  BID an famotidine  20mg  at bedtime, sleeping reclined. Avoids trigger foods, has breakthrough if he decreases meds.   Recommended to continue protonix  40mg  IBD, famotidine  40mg  at bedtime, continue to avoid trigger foods   Present: Doing well on PPI BID and famotidine  40mg  at bedtime. No breakthrough unless he forgets a dose. States he mailed back his recall letter for EGD but unsure if we got it. No red flag symptoms. Patient denies melena, hematochezia, nausea, vomiting, diarrhea, constipation, dysphagia, odyonophagia, early satiety or weight loss.    Last EGD: 06/06/2020 - 2 cm hiatal hernia. No Barrett's esophagus was seen - A single mucosal papule (nodule) found in the stomach. Biopsied. - Normal examined duodenum.   Path: A. STOMACH, NODULE, BIOPSY:  - Hyperplastic gastric mucosa with with active inflammation and reactive  changes.  - No intestinal metaplasia, adenomatous change or carcinoma.   B. STOMACH, RANDOM, BIOPSY:  - Antral and oxyntic mucosa with mild inflammation and reactive changes.  - Warthin-Starry negative for Helicobacter pylori.  - No intestinal metaplasia, dysplasia or carcinoma.    Reccomended repeat EGD in 3 years   Last Colonoscopy: Never      Filed Weights   07/19/23 0841  Weight: 260 lb 14.4 oz (118.3 kg)     Past Medical History:  Diagnosis Date   Barrett's esophagus    Gallstones    GERD (gastroesophageal reflux  disease)    Gout     Past Surgical History:  Procedure Laterality Date   BIOPSY  11/05/2016   Procedure: BIOPSY;  Surgeon: Ruby Corporal, MD;  Location: [facility];  Service: Endoscopy;;  esophagus antrum   BIOPSY  06/06/2020   Procedure: BIOPSY;  Surgeon: Urban Garden, MD;  Location: [facility];  Service: Gastroenterology;;   CHOLECYSTECTOMY N/A 02/03/2018   Procedure: LAPAROSCOPIC CHOLECYSTECTOMY;  Surgeon: Alanda Allegra, MD;  Location: [facility];  Service: General;  Laterality: N/A;   ESOPHAGOGASTRODUODENOSCOPY N/A 11/05/2016   Procedure: ESOPHAGOGASTRODUODENOSCOPY (EGD);  Surgeon: Ruby Corporal, MD;  Location: [facility];  Service: Endoscopy;  Laterality: N/A;  10:50   ESOPHAGOGASTRODUODENOSCOPY (EGD) WITH PROPOFOL  N/A 06/06/2020   Procedure: ESOPHAGOGASTRODUODENOSCOPY (EGD) WITH PROPOFOL ;  Surgeon: Urban Garden, MD;  Location: [facility];  Service: Gastroenterology;  Laterality: N/A;  AM   WRIST SURGERY Left     Current Outpatient Medications  Medication Sig Dispense Refill   famotidine  (PEPCID ) 40 MG tablet Take 1 tablet (40 mg total) by mouth daily. 90 tablet 3   pantoprazole  (PROTONIX ) 40 MG tablet TAKE 1 TABLET(40 MG) BY MOUTH TWICE DAILY 180 tablet 0   No current facility-administered medications for this visit.    Allergies as of 07/19/2023   (No Known Allergies)    Social History   Socioeconomic History   Marital status: Married    Spouse name: Not on file   Number of children: Not on file  Years of education: Not on file   Highest education level: Not on file  Occupational History   Not on file  Tobacco Use   Smoking status: Never   Smokeless tobacco: Never  Vaping Use   Vaping status: Never Used  Substance and Sexual Activity   Alcohol use: Yes    Comment: rarely   Drug use: No   Sexual activity: Yes    Birth control/protection: None  Other Topics Concern   Not on file  Social History Narrative   Not on file    Social Drivers of Health   Financial Resource Strain: Not on file  Food Insecurity: Not on file  Transportation Needs: Not on file  Physical Activity: Not on file  Stress: Not on file  Social Connections: Not on file    Review of systems General: negative for malaise, night sweats, fever, chills, weight loss Neck: Negative for lumps, goiter, pain and significant neck swelling Resp: Negative for cough, wheezing, dyspnea at rest CV: Negative for chest pain, leg swelling, palpitations, orthopnea GI: denies melena, hematochezia, nausea, vomiting, diarrhea, constipation, dysphagia, odyonophagia, early satiety or unintentional weight loss.  The remainder of the review of systems is noncontributory.  Physical Exam: BP 120/77   Pulse 62   Temp 97.6 F (36.4 C)   Ht 5\' 10"  (1.778 m)   Wt 260 lb 14.4 oz (118.3 kg)   BMI 37.44 kg/m  General:   Alert and oriented. No distress noted. Pleasant and cooperative.  Head:  Normocephalic and atraumatic. Eyes:  Conjuctiva clear without scleral icterus. Mouth:  Oral mucosa pink and moist. Good dentition. No lesions. Heart: Normal rate and rhythm, s1 and s2 heart sounds present.  Lungs: Clear lung sounds in all lobes. Respirations equal and unlabored. Abdomen:  +BS, soft, non-tender and non-distended. No rebound or guarding. No HSM or masses noted. Derm: No palmar erythema or jaundice Msk:  Symmetrical without gross deformities. Normal posture. Extremities:  Without edema. Neurologic:  Alert and  oriented x4 Psych:  Alert and cooperative. Normal mood and affect.  Invalid input(s): "6 MONTHS"   ASSESSMENT: JACQUELYN ANTONY is a 43 y.o. male presenting today for follow up of GERD and Barrett's esophagus without dysplasia  GERD well managed on protonix  40mg  BID and famotidine  40mg  at bedtime. He has breakthrough only if he forgets a dose. Denies any other GI symptoms. Last EGD in 2022 without evidence of BE, however, patient was recommended to  have repeat EGD this year due to history of BE. Indications, risks and benefits of procedure discussed in detail with patient. Patient verbalized understanding and is in agreement to proceed with EGD.    PLAN:  -continue Protonix  40mg  BID -continue famotidine  40mg  at bedtime -schedule EGD ASAII -good reflux precautions  All questions were answered, patient verbalized understanding and is in agreement with plan as outlined above.   Follow Up: 1 year   Shalunda Lindh L. Catrice Zuleta, MSN, APRN, AGNP-C Adult-Gerontology Nurse Practitioner [facility]

## 2023-07-20 ENCOUNTER — Telehealth: Payer: Self-pay | Admitting: *Deleted

## 2023-07-20 NOTE — Telephone Encounter (Signed)
 LMOVM to call back to schedule EGD with Dr. Sammi Crick, ASA 2

## 2023-07-29 ENCOUNTER — Encounter: Payer: Self-pay | Admitting: *Deleted

## 2023-07-29 NOTE — Telephone Encounter (Signed)
 [employer] PA :  Date of Service: 08/19/2023   CPT Code: GEEGD Description: EGD-esophagogastroduodenoscopy Case Number: [id number] Review Date: 07/29/2023 10:45:00 AM Expiration Date: N/A Status: Your case has been sent to clinical review. You will be notified via fax within 2 business days if additional clinical information is needed. If you wish to speak with [employer] at anytime, please call [phone]. The prior authorization you submitted, Case [id number], has been received.

## 2023-07-29 NOTE — Telephone Encounter (Signed)
 Pt has been scheduled for 08/19/23. Instructions sent via mychart

## 2023-07-29 NOTE — Telephone Encounter (Signed)
 LMOVM to call back. Letter mailed. ?

## 2023-08-03 NOTE — Telephone Encounter (Signed)
 Insurance denial for EGD placed on desk

## 2023-08-08 NOTE — Telephone Encounter (Signed)
 Unable to find appeal letter

## 2023-08-09 NOTE — Telephone Encounter (Signed)
 Message Received: Today Gaylene Madelin CROME, LPN  Neysa Coy H We sent an appeal letter this morning so please put him back on for 7/11, pt has been notified.       Previous Messages    ----- Message ----- From: Neysa Coy DEL Sent: 08/09/2023  12:02 PM EDT To: Madelin CROME Gaylene, LPN  This patient called and states he got a letter from his insurance that they will need to review.  He wants to cancel for now.  I put him in the depot let me know if I should just cancel.  Thanks,

## 2023-08-09 NOTE — Telephone Encounter (Signed)
 Appeal letter faxed.

## 2023-08-11 NOTE — Telephone Encounter (Signed)
 [employer]: Appeal Decision After reviewing the appeal, the original decision to deny coverage of CPT GEEGD - A  test where a flexible tube is passed through the mouth and then the tube that connects  the throat with the stomach and upper part of intestines, to examine the upper part of  your gastrointestinal tract. to be rendered at [facility] is upheld. All the  original information in your file, the information submitted with this request and the terms  of your benefit plan were reviewed. More About The Decision This decision was made on 08/10/2023 by Clinical Peer Reviewer  Provider can call to do Peer to Peer at [phone] option 3

## 2023-08-15 NOTE — Telephone Encounter (Signed)
 Message sent to endo to cancel procedure for 08/19/23

## 2023-08-15 NOTE — Telephone Encounter (Signed)
 Barry Robertson, please cancel his upcoming EGD Thanks

## 2023-08-15 NOTE — Telephone Encounter (Signed)
 I called the insurance today to discuss possible peer to peer.  I was transferred multiple times to [employer] and [employer] who is stated that the case was closed and no second level appeal could be done.  They also stated for this case, no peer to peer was available.  My authorization number was [id number].  I questioned the insurance staff regarding this decision  as declining coverage for this was never discussed with any of the medical team for my office, but it was only answered with a letter and we were not given the option for a peer to peer discussion.  I clearly stated to them that if the patient were to develop an esophageal malignancy, the insurance should be held accountable for not covering the procedure that has clear preventative and surveillance indications (patient was found to have Barrett's esophagus in 2018).  I informed the patient who understood.  He will try to reach the insurance as well.  He would like to cancel his procedure on Friday.

## 2023-08-19 ENCOUNTER — Encounter (HOSPITAL_COMMUNITY): Admission: RE | Payer: Self-pay | Source: Home / Self Care

## 2023-08-19 ENCOUNTER — Ambulatory Visit (HOSPITAL_COMMUNITY): Admission: RE | Admit: 2023-08-19 | Source: Home / Self Care | Admitting: Gastroenterology

## 2023-08-19 SURGERY — EGD (ESOPHAGOGASTRODUODENOSCOPY)
Anesthesia: Choice

## 2023-10-04 ENCOUNTER — Encounter (INDEPENDENT_AMBULATORY_CARE_PROVIDER_SITE_OTHER): Payer: Self-pay

## 2023-10-04 ENCOUNTER — Telehealth (INDEPENDENT_AMBULATORY_CARE_PROVIDER_SITE_OTHER): Payer: Self-pay

## 2023-10-04 ENCOUNTER — Other Ambulatory Visit (INDEPENDENT_AMBULATORY_CARE_PROVIDER_SITE_OTHER): Payer: Self-pay

## 2023-10-04 DIAGNOSIS — K449 Diaphragmatic hernia without obstruction or gangrene: Secondary | ICD-10-CM

## 2023-10-04 DIAGNOSIS — K219 Gastro-esophageal reflux disease without esophagitis: Secondary | ICD-10-CM

## 2023-10-04 MED ORDER — PANTOPRAZOLE SODIUM 40 MG PO TBEC: DELAYED_RELEASE_TABLET | ORAL | 0 refills | Status: DC

## 2023-10-04 NOTE — Telephone Encounter (Signed)
 Barry Robertson (Key: [id number]) PA Case ID #: [id number] Rx #: [id number] Need Help? Call us  at [phone] Outcome Approved today by [facility] 2017 CaseId:[id number];Status:Approved;Review Type:Qty;Coverage Start Date:10/04/2023;Coverage End Date:10/03/2024; Effective Date: 10/04/2023 Authorization Expiration Date: 10/03/2024 Drug Pantoprazole  Sodium 40MG  dr tablets ePA cloud Pension scheme manager PA Form [phone] NCPDP) Original Claim Info 76 MD CALL HELP DESK

## 2023-11-23 ENCOUNTER — Encounter (INDEPENDENT_AMBULATORY_CARE_PROVIDER_SITE_OTHER): Payer: Self-pay | Admitting: Gastroenterology

## 2024-01-29 ENCOUNTER — Other Ambulatory Visit (INDEPENDENT_AMBULATORY_CARE_PROVIDER_SITE_OTHER): Payer: Self-pay | Admitting: Gastroenterology

## 2024-01-29 DIAGNOSIS — K219 Gastro-esophageal reflux disease without esophagitis: Secondary | ICD-10-CM

## 2024-01-29 DIAGNOSIS — K449 Diaphragmatic hernia without obstruction or gangrene: Secondary | ICD-10-CM

## 2024-02-16 ENCOUNTER — Other Ambulatory Visit (INDEPENDENT_AMBULATORY_CARE_PROVIDER_SITE_OTHER): Payer: Self-pay | Admitting: Gastroenterology

## 2024-02-16 DIAGNOSIS — K219 Gastro-esophageal reflux disease without esophagitis: Secondary | ICD-10-CM

## 2024-02-16 DIAGNOSIS — K449 Diaphragmatic hernia without obstruction or gangrene: Secondary | ICD-10-CM
